# Patient Record
Sex: Female | Born: 1970 | Race: White | Hispanic: No | Marital: Married | Smoking: Never smoker
Health system: Southern US, Community
[De-identification: ages and names within clinical notes are randomized; demographics above are authoritative.]

## PROBLEM LIST (undated history)

## (undated) ENCOUNTER — Inpatient Hospital Stay (HOSPITAL_COMMUNITY): Payer: Self-pay

## (undated) DIAGNOSIS — Z9889 Other specified postprocedural states: Secondary | ICD-10-CM

## (undated) DIAGNOSIS — R112 Nausea with vomiting, unspecified: Secondary | ICD-10-CM

## (undated) DIAGNOSIS — Z8619 Personal history of other infectious and parasitic diseases: Secondary | ICD-10-CM

## (undated) DIAGNOSIS — O09529 Supervision of elderly multigravida, unspecified trimester: Secondary | ICD-10-CM

## (undated) DIAGNOSIS — G43909 Migraine, unspecified, not intractable, without status migrainosus: Secondary | ICD-10-CM

## (undated) DIAGNOSIS — C801 Malignant (primary) neoplasm, unspecified: Secondary | ICD-10-CM

## (undated) DIAGNOSIS — F419 Anxiety disorder, unspecified: Secondary | ICD-10-CM

## (undated) HISTORY — DX: Anxiety disorder, unspecified: F41.9

## (undated) HISTORY — DX: Personal history of other infectious and parasitic diseases: Z86.19

## (undated) HISTORY — PX: GYNECOLOGIC CRYOSURGERY: SHX857

## (undated) HISTORY — DX: Malignant (primary) neoplasm, unspecified: C80.1

## (undated) HISTORY — DX: Supervision of elderly multigravida, unspecified trimester: O09.529

---

## 1992-05-10 HISTORY — PX: WISDOM TOOTH EXTRACTION: SHX21

## 1997-07-17 ENCOUNTER — Inpatient Hospital Stay: Admission: AD | Admit: 1997-07-17 | Discharge: 1997-07-17 | Payer: Self-pay | Admitting: Obstetrics and Gynecology

## 1997-08-21 ENCOUNTER — Inpatient Hospital Stay (HOSPITAL_COMMUNITY): Admission: AD | Admit: 1997-08-21 | Discharge: 1997-08-23 | Payer: Self-pay | Admitting: Obstetrics and Gynecology

## 1997-09-17 ENCOUNTER — Encounter: Admission: RE | Admit: 1997-09-17 | Discharge: 1997-12-16 | Payer: Self-pay | Admitting: Obstetrics and Gynecology

## 1999-10-30 ENCOUNTER — Other Ambulatory Visit: Admission: RE | Admit: 1999-10-30 | Discharge: 1999-10-30 | Payer: Self-pay | Admitting: Emergency Medicine

## 2003-05-09 ENCOUNTER — Other Ambulatory Visit: Admission: RE | Admit: 2003-05-09 | Discharge: 2003-05-09 | Payer: Self-pay | Admitting: Obstetrics and Gynecology

## 2003-05-11 HISTORY — PX: MELANOMA EXCISION: SHX5266

## 2003-10-08 ENCOUNTER — Encounter: Admission: RE | Admit: 2003-10-08 | Discharge: 2003-10-08 | Payer: Self-pay | Admitting: Obstetrics and Gynecology

## 2003-11-16 ENCOUNTER — Inpatient Hospital Stay (HOSPITAL_COMMUNITY): Admission: AD | Admit: 2003-11-16 | Discharge: 2003-11-16 | Payer: Self-pay | Admitting: Obstetrics and Gynecology

## 2003-12-01 ENCOUNTER — Inpatient Hospital Stay (HOSPITAL_COMMUNITY): Admission: AD | Admit: 2003-12-01 | Discharge: 2003-12-03 | Payer: Self-pay | Admitting: Obstetrics & Gynecology

## 2004-01-15 ENCOUNTER — Other Ambulatory Visit: Admission: RE | Admit: 2004-01-15 | Discharge: 2004-01-15 | Payer: Self-pay | Admitting: Obstetrics and Gynecology

## 2004-03-10 ENCOUNTER — Ambulatory Visit (HOSPITAL_BASED_OUTPATIENT_CLINIC_OR_DEPARTMENT_OTHER): Admission: RE | Admit: 2004-03-10 | Discharge: 2004-03-10 | Payer: Self-pay

## 2004-03-10 ENCOUNTER — Ambulatory Visit (HOSPITAL_COMMUNITY): Admission: RE | Admit: 2004-03-10 | Discharge: 2004-03-10 | Payer: Self-pay

## 2005-04-20 ENCOUNTER — Other Ambulatory Visit: Admission: RE | Admit: 2005-04-20 | Discharge: 2005-04-20 | Payer: Self-pay | Admitting: Obstetrics and Gynecology

## 2011-08-23 LAB — OB RESULTS CONSOLE ANTIBODY SCREEN: Antibody Screen: NEGATIVE

## 2011-08-23 LAB — OB RESULTS CONSOLE GC/CHLAMYDIA: Gonorrhea: NEGATIVE

## 2011-08-23 LAB — OB RESULTS CONSOLE HEPATITIS B SURFACE ANTIGEN: Hepatitis B Surface Ag: NEGATIVE

## 2011-08-23 LAB — OB RESULTS CONSOLE ABO/RH: RH Type: POSITIVE

## 2011-08-25 ENCOUNTER — Other Ambulatory Visit (HOSPITAL_COMMUNITY): Payer: Self-pay | Admitting: Obstetrics and Gynecology

## 2011-08-25 DIAGNOSIS — R1011 Right upper quadrant pain: Secondary | ICD-10-CM

## 2011-08-26 ENCOUNTER — Ambulatory Visit (HOSPITAL_COMMUNITY)
Admission: RE | Admit: 2011-08-26 | Discharge: 2011-08-26 | Disposition: A | Discharge status: Home / Self Care | Payer: Federal, State, Local not specified - PPO | Source: Ambulatory Visit | Attending: Obstetrics and Gynecology | Admitting: Obstetrics and Gynecology

## 2011-08-26 DIAGNOSIS — R1011 Right upper quadrant pain: Secondary | ICD-10-CM

## 2011-08-26 DIAGNOSIS — O99891 Other specified diseases and conditions complicating pregnancy: Secondary | ICD-10-CM | POA: Insufficient documentation

## 2011-08-26 DIAGNOSIS — K7689 Other specified diseases of liver: Secondary | ICD-10-CM | POA: Insufficient documentation

## 2012-02-26 ENCOUNTER — Inpatient Hospital Stay (HOSPITAL_COMMUNITY)
Admission: AD | Admit: 2012-02-26 | Discharge: 2012-02-26 | Disposition: A | Discharge status: Home / Self Care | Payer: Federal, State, Local not specified - PPO | Source: Ambulatory Visit | Attending: Obstetrics and Gynecology | Admitting: Obstetrics and Gynecology

## 2012-02-26 ENCOUNTER — Encounter (HOSPITAL_COMMUNITY): Payer: Self-pay | Admitting: *Deleted

## 2012-02-26 DIAGNOSIS — O99891 Other specified diseases and conditions complicating pregnancy: Secondary | ICD-10-CM | POA: Insufficient documentation

## 2012-02-26 DIAGNOSIS — N949 Unspecified condition associated with female genital organs and menstrual cycle: Secondary | ICD-10-CM | POA: Insufficient documentation

## 2012-02-26 HISTORY — DX: Nausea with vomiting, unspecified: R11.2

## 2012-02-26 HISTORY — DX: Other specified postprocedural states: Z98.890

## 2012-02-26 LAB — POCT FERN TEST: Fern Test: NEGATIVE

## 2012-02-26 NOTE — MAU Note (Signed)
Report given to Dr. Arelia Sneddon patient G3P2 [redacted]w[redacted]d presented to MAU with c/o possible SROM, spec exam done, fern test negative, cervix closed/thick/high, fhr reactive, received order to discharge patient to home.

## 2012-02-26 NOTE — MAU Provider Note (Signed)
Speculum exam for questionable ROM- no pooling of fluid noted; creamy beige discharge noted; cervix closed.   Pt gives hx of one episode of ?leakage after IC this morning. FHR reactive occ ctx.

## 2012-02-26 NOTE — MAU Note (Signed)
Pt woke up this morning she noticed some fluid leaking out of her vagina this morning. None since then. Reported she did have intercourse last night and it may be from that but wanted to make sure. Reports good fetal movement and mild occasional contractions that she has been having (not new).

## 2012-02-26 NOTE — MAU Note (Signed)
Has gush of fluid this morning upon awakening, intercourse last night but patient is concerned could be her amniotic fluid.

## 2012-03-02 LAB — OB RESULTS CONSOLE GBS: GBS: NEGATIVE

## 2012-03-20 ENCOUNTER — Encounter (HOSPITAL_COMMUNITY): Payer: Self-pay | Admitting: *Deleted

## 2012-03-20 ENCOUNTER — Telehealth (HOSPITAL_COMMUNITY): Payer: Self-pay | Admitting: *Deleted

## 2012-03-20 NOTE — Telephone Encounter (Signed)
Preadmission screen  

## 2012-03-21 ENCOUNTER — Encounter (HOSPITAL_COMMUNITY): Payer: Self-pay | Admitting: Anesthesiology

## 2012-03-21 ENCOUNTER — Inpatient Hospital Stay (HOSPITAL_COMMUNITY)
Admission: RE | Admit: 2012-03-21 | Discharge: 2012-03-23 | DRG: 371 | Disposition: A | Discharge status: Home / Self Care | Payer: Federal, State, Local not specified - PPO | Source: Ambulatory Visit | Attending: Obstetrics and Gynecology | Admitting: Obstetrics and Gynecology

## 2012-03-21 ENCOUNTER — Encounter (HOSPITAL_COMMUNITY): Payer: Self-pay

## 2012-03-21 ENCOUNTER — Encounter (HOSPITAL_COMMUNITY)
Admission: RE | Disposition: A | Discharge status: Home / Self Care | Payer: Self-pay | Source: Ambulatory Visit | Attending: Obstetrics and Gynecology

## 2012-03-21 ENCOUNTER — Observation Stay (HOSPITAL_COMMUNITY): Payer: Federal, State, Local not specified - PPO | Admitting: Anesthesiology

## 2012-03-21 DIAGNOSIS — O409XX0 Polyhydramnios, unspecified trimester, not applicable or unspecified: Secondary | ICD-10-CM | POA: Diagnosis present

## 2012-03-21 DIAGNOSIS — O09529 Supervision of elderly multigravida, unspecified trimester: Secondary | ICD-10-CM | POA: Diagnosis present

## 2012-03-21 LAB — CBC
MCH: 29.7 pg (ref 26.0–34.0)
MCHC: 33.2 g/dL (ref 30.0–36.0)
Platelets: 144 10*3/uL — ABNORMAL LOW (ref 150–400)
Platelets: 120 10*3/uL — ABNORMAL LOW (ref 150–400)
RBC: 4.18 MIL/uL (ref 3.87–5.11)
RBC: 3.8 MIL/uL — ABNORMAL LOW (ref 3.87–5.11)
WBC: 7.1 10*3/uL (ref 4.0–10.5)

## 2012-03-21 LAB — RPR: RPR Ser Ql: NONREACTIVE

## 2012-03-21 LAB — ABO/RH: ABO/RH(D): O POS

## 2012-03-21 SURGERY — Surgical Case
Anesthesia: Epidural | Site: Abdomen | Wound class: Clean Contaminated

## 2012-03-21 MED ORDER — ONDANSETRON HCL 4 MG/2ML IJ SOLN
INTRAMUSCULAR | Status: AC
  Filled 2012-03-21: qty 2

## 2012-03-21 MED ORDER — HYDROMORPHONE HCL PF 1 MG/ML IJ SOLN
INTRAMUSCULAR | Status: AC
  Filled 2012-03-21: qty 1

## 2012-03-21 MED ORDER — DIPHENHYDRAMINE HCL 50 MG/ML IJ SOLN: 12.5000 mg | INTRAMUSCULAR | Status: DC | PRN

## 2012-03-21 MED ORDER — DIPHENHYDRAMINE HCL 50 MG/ML IJ SOLN
12.5000 mg | INTRAMUSCULAR | Status: DC | PRN
  Administered 2012-03-22: 12.5 mg via INTRAVENOUS
  Filled 2012-03-21: qty 1

## 2012-03-21 MED ORDER — SIMETHICONE 80 MG PO CHEW
80.0000 mg | CHEWABLE_TABLET | Freq: Three times a day (TID) | ORAL | Status: DC
  Administered 2012-03-22 – 2012-03-23 (×5): 80 mg via ORAL

## 2012-03-21 MED ORDER — ZOLPIDEM TARTRATE 5 MG PO TABS: 5.0000 mg | ORAL_TABLET | Freq: Every evening | ORAL | Status: DC | PRN

## 2012-03-21 MED ORDER — LACTATED RINGERS IV SOLN
INTRAVENOUS | Status: DC | PRN
  Administered 2012-03-21 (×2): via INTRAVENOUS

## 2012-03-21 MED ORDER — METOCLOPRAMIDE HCL 5 MG/ML IJ SOLN: 10.0000 mg | Freq: Three times a day (TID) | INTRAMUSCULAR | Status: DC | PRN

## 2012-03-21 MED ORDER — PHENYLEPHRINE HCL 10 MG/ML IJ SOLN
INTRAMUSCULAR | Status: DC | PRN
  Administered 2012-03-21: 80 ug via INTRAVENOUS
  Administered 2012-03-21: 40 ug via INTRAVENOUS
  Administered 2012-03-21 (×2): 80 ug via INTRAVENOUS
  Administered 2012-03-21 (×2): 40 ug via INTRAVENOUS

## 2012-03-21 MED ORDER — OXYTOCIN BOLUS FROM INFUSION: 500.0000 mL | INTRAVENOUS | Status: DC

## 2012-03-21 MED ORDER — EPHEDRINE 5 MG/ML INJ: 10.0000 mg | INTRAVENOUS | Status: DC | PRN

## 2012-03-21 MED ORDER — IBUPROFEN 600 MG PO TABS: 600.0000 mg | ORAL_TABLET | Freq: Four times a day (QID) | ORAL | Status: DC | PRN

## 2012-03-21 MED ORDER — OXYTOCIN 10 UNIT/ML IJ SOLN
40.0000 [IU] | INTRAVENOUS | Status: DC | PRN
  Administered 2012-03-21: 40 [IU] via INTRAVENOUS

## 2012-03-21 MED ORDER — ONDANSETRON HCL 4 MG/2ML IJ SOLN: 4.0000 mg | Freq: Four times a day (QID) | INTRAMUSCULAR | Status: DC | PRN

## 2012-03-21 MED ORDER — KETOROLAC TROMETHAMINE 60 MG/2ML IM SOLN
60.0000 mg | Freq: Once | INTRAMUSCULAR | Status: AC | PRN
  Filled 2012-03-21: qty 2

## 2012-03-21 MED ORDER — BUTORPHANOL TARTRATE 1 MG/ML IJ SOLN: 1.0000 mg | INTRAMUSCULAR | Status: DC | PRN

## 2012-03-21 MED ORDER — OXYTOCIN 40 UNITS IN LACTATED RINGERS INFUSION - SIMPLE MED: 62.5000 mL/h | INTRAVENOUS | Status: DC

## 2012-03-21 MED ORDER — MEPERIDINE HCL 25 MG/ML IJ SOLN: 6.2500 mg | INTRAMUSCULAR | Status: DC | PRN

## 2012-03-21 MED ORDER — DIPHENHYDRAMINE HCL 25 MG PO CAPS
25.0000 mg | ORAL_CAPSULE | ORAL | Status: DC | PRN
  Administered 2012-03-22: 25 mg via ORAL
  Filled 2012-03-21: qty 1

## 2012-03-21 MED ORDER — SCOPOLAMINE 1 MG/3DAYS TD PT72
MEDICATED_PATCH | TRANSDERMAL | Status: AC
  Filled 2012-03-21: qty 1

## 2012-03-21 MED ORDER — ONDANSETRON HCL 4 MG PO TABS: 4.0000 mg | ORAL_TABLET | ORAL | Status: DC | PRN

## 2012-03-21 MED ORDER — SODIUM BICARBONATE 8.4 % IV SOLN
INTRAVENOUS | Status: AC
  Filled 2012-03-21: qty 50

## 2012-03-21 MED ORDER — MEPERIDINE HCL 25 MG/ML IJ SOLN
6.2500 mg | INTRAMUSCULAR | Status: DC | PRN
  Administered 2012-03-21: 6.25 mg via INTRAVENOUS

## 2012-03-21 MED ORDER — WITCH HAZEL-GLYCERIN EX PADS: 1.0000 "application " | MEDICATED_PAD | CUTANEOUS | Status: DC | PRN

## 2012-03-21 MED ORDER — LACTATED RINGERS IV SOLN
INTRAVENOUS | Status: DC
  Administered 2012-03-22 (×2): via INTRAVENOUS

## 2012-03-21 MED ORDER — DIPHENHYDRAMINE HCL 50 MG/ML IJ SOLN: 25.0000 mg | INTRAMUSCULAR | Status: DC | PRN

## 2012-03-21 MED ORDER — EPHEDRINE 5 MG/ML INJ
10.0000 mg | INTRAVENOUS | Status: DC | PRN
  Administered 2012-03-21: 10 mg via INTRAVENOUS
  Filled 2012-03-21: qty 4

## 2012-03-21 MED ORDER — LACTATED RINGERS IV SOLN: 500.0000 mL | Freq: Once | INTRAVENOUS | Status: DC

## 2012-03-21 MED ORDER — LANOLIN HYDROUS EX OINT: 1.0000 "application " | TOPICAL_OINTMENT | CUTANEOUS | Status: DC | PRN

## 2012-03-21 MED ORDER — SENNOSIDES-DOCUSATE SODIUM 8.6-50 MG PO TABS
2.0000 | ORAL_TABLET | Freq: Every day | ORAL | Status: DC
  Administered 2012-03-22: 2 via ORAL

## 2012-03-21 MED ORDER — SODIUM CHLORIDE 0.9 % IV SOLN
1.0000 ug/kg/h | INTRAVENOUS | Status: DC | PRN
  Filled 2012-03-21: qty 2.5

## 2012-03-21 MED ORDER — SODIUM BICARBONATE 8.4 % IV SOLN
INTRAVENOUS | Status: DC | PRN
  Administered 2012-03-21: 3 mL via EPIDURAL

## 2012-03-21 MED ORDER — PHENYLEPHRINE 40 MCG/ML (10ML) SYRINGE FOR IV PUSH (FOR BLOOD PRESSURE SUPPORT)
PREFILLED_SYRINGE | INTRAVENOUS | Status: AC
  Filled 2012-03-21: qty 15

## 2012-03-21 MED ORDER — DIPHENHYDRAMINE HCL 25 MG PO CAPS: 25.0000 mg | ORAL_CAPSULE | Freq: Four times a day (QID) | ORAL | Status: DC | PRN

## 2012-03-21 MED ORDER — NALBUPHINE HCL 10 MG/ML IJ SOLN
5.0000 mg | INTRAMUSCULAR | Status: DC | PRN
  Administered 2012-03-22: 5 mg via INTRAVENOUS
  Filled 2012-03-21 (×2): qty 1

## 2012-03-21 MED ORDER — LIDOCAINE HCL (PF) 1 % IJ SOLN: 30.0000 mL | INTRAMUSCULAR | Status: DC | PRN

## 2012-03-21 MED ORDER — NALOXONE HCL 0.4 MG/ML IJ SOLN: 0.4000 mg | INTRAMUSCULAR | Status: DC | PRN

## 2012-03-21 MED ORDER — MORPHINE SULFATE (PF) 0.5 MG/ML IJ SOLN
INTRAMUSCULAR | Status: DC | PRN
  Administered 2012-03-21: 4 mg via EPIDURAL

## 2012-03-21 MED ORDER — EPHEDRINE SULFATE 50 MG/ML IJ SOLN
INTRAMUSCULAR | Status: DC | PRN
  Administered 2012-03-21: 10 mg via INTRAVENOUS

## 2012-03-21 MED ORDER — MEPERIDINE HCL 25 MG/ML IJ SOLN
INTRAMUSCULAR | Status: DC | PRN
  Administered 2012-03-21 (×2): 12.5 mg via INTRAVENOUS

## 2012-03-21 MED ORDER — LACTATED RINGERS IV SOLN
INTRAVENOUS | Status: DC | PRN
  Administered 2012-03-21: 19:00:00 via INTRAVENOUS

## 2012-03-21 MED ORDER — PHENYLEPHRINE 40 MCG/ML (10ML) SYRINGE FOR IV PUSH (FOR BLOOD PRESSURE SUPPORT)
80.0000 ug | PREFILLED_SYRINGE | INTRAVENOUS | Status: DC | PRN
  Filled 2012-03-21: qty 5

## 2012-03-21 MED ORDER — CEFAZOLIN SODIUM-DEXTROSE 2-3 GM-% IV SOLR
INTRAVENOUS | Status: DC | PRN
  Administered 2012-03-21: 2 g via INTRAVENOUS

## 2012-03-21 MED ORDER — KETOROLAC TROMETHAMINE 30 MG/ML IJ SOLN: 30.0000 mg | Freq: Four times a day (QID) | INTRAMUSCULAR | Status: AC | PRN

## 2012-03-21 MED ORDER — LACTATED RINGERS IV SOLN: 500.0000 mL | INTRAVENOUS | Status: DC | PRN

## 2012-03-21 MED ORDER — LACTATED RINGERS IV SOLN
INTRAVENOUS | Status: DC
  Administered 2012-03-21 (×3): via INTRAVENOUS

## 2012-03-21 MED ORDER — LIDOCAINE HCL (PF) 1 % IJ SOLN
INTRAMUSCULAR | Status: DC | PRN
  Administered 2012-03-21 (×2): 9 mL

## 2012-03-21 MED ORDER — TERBUTALINE SULFATE 1 MG/ML IJ SOLN: 0.2500 mg | Freq: Once | INTRAMUSCULAR | Status: DC | PRN

## 2012-03-21 MED ORDER — ONDANSETRON HCL 4 MG/2ML IJ SOLN: 4.0000 mg | Freq: Three times a day (TID) | INTRAMUSCULAR | Status: DC | PRN

## 2012-03-21 MED ORDER — EPHEDRINE 5 MG/ML INJ
INTRAVENOUS | Status: AC
  Filled 2012-03-21: qty 10

## 2012-03-21 MED ORDER — ACETAMINOPHEN 325 MG PO TABS: 650.0000 mg | ORAL_TABLET | ORAL | Status: DC | PRN

## 2012-03-21 MED ORDER — NALBUPHINE HCL 10 MG/ML IJ SOLN
5.0000 mg | INTRAMUSCULAR | Status: DC | PRN
  Administered 2012-03-22: 5 mg via SUBCUTANEOUS
  Administered 2012-03-22: 10 mg via SUBCUTANEOUS
  Filled 2012-03-21 (×2): qty 1

## 2012-03-21 MED ORDER — DIBUCAINE 1 % RE OINT: 1.0000 "application " | TOPICAL_OINTMENT | RECTAL | Status: DC | PRN

## 2012-03-21 MED ORDER — OXYCODONE-ACETAMINOPHEN 5-325 MG PO TABS
1.0000 | ORAL_TABLET | ORAL | Status: DC | PRN
  Administered 2012-03-22: 1 via ORAL
  Administered 2012-03-22: 2 via ORAL
  Administered 2012-03-22 (×3): 1 via ORAL
  Administered 2012-03-23: 2 via ORAL
  Administered 2012-03-23: 1 via ORAL
  Administered 2012-03-23: 2 via ORAL
  Administered 2012-03-23: 1 via ORAL
  Filled 2012-03-21 (×2): qty 2
  Filled 2012-03-21 (×3): qty 1
  Filled 2012-03-21 (×2): qty 2
  Filled 2012-03-21 (×2): qty 1

## 2012-03-21 MED ORDER — SCOPOLAMINE 1 MG/3DAYS TD PT72
1.0000 | MEDICATED_PATCH | Freq: Once | TRANSDERMAL | Status: DC
  Administered 2012-03-21: 1.5 mg via TRANSDERMAL

## 2012-03-21 MED ORDER — PROMETHAZINE HCL 25 MG/ML IJ SOLN: 6.2500 mg | INTRAMUSCULAR | Status: DC | PRN

## 2012-03-21 MED ORDER — MEPERIDINE HCL 25 MG/ML IJ SOLN
INTRAMUSCULAR | Status: AC
  Filled 2012-03-21: qty 1

## 2012-03-21 MED ORDER — MORPHINE SULFATE 0.5 MG/ML IJ SOLN
INTRAMUSCULAR | Status: AC
  Filled 2012-03-21: qty 10

## 2012-03-21 MED ORDER — SIMETHICONE 80 MG PO CHEW: 80.0000 mg | CHEWABLE_TABLET | ORAL | Status: DC | PRN

## 2012-03-21 MED ORDER — OXYTOCIN 40 UNITS IN LACTATED RINGERS INFUSION - SIMPLE MED: 62.5000 mL/h | INTRAVENOUS | Status: AC

## 2012-03-21 MED ORDER — MENTHOL 3 MG MT LOZG: 1.0000 | LOZENGE | OROMUCOSAL | Status: DC | PRN

## 2012-03-21 MED ORDER — ONDANSETRON HCL 4 MG/2ML IJ SOLN: 4.0000 mg | INTRAMUSCULAR | Status: DC | PRN

## 2012-03-21 MED ORDER — FENTANYL 2.5 MCG/ML BUPIVACAINE 1/10 % EPIDURAL INFUSION (WH - ANES)
INTRAMUSCULAR | Status: DC | PRN
  Administered 2012-03-21: 14 mL/h via EPIDURAL

## 2012-03-21 MED ORDER — IBUPROFEN 600 MG PO TABS
600.0000 mg | ORAL_TABLET | Freq: Four times a day (QID) | ORAL | Status: DC
  Administered 2012-03-22 – 2012-03-23 (×6): 600 mg via ORAL
  Filled 2012-03-21 (×7): qty 1

## 2012-03-21 MED ORDER — PHENYLEPHRINE 40 MCG/ML (10ML) SYRINGE FOR IV PUSH (FOR BLOOD PRESSURE SUPPORT): 80.0000 ug | PREFILLED_SYRINGE | INTRAVENOUS | Status: DC | PRN

## 2012-03-21 MED ORDER — TETANUS-DIPHTH-ACELL PERTUSSIS 5-2.5-18.5 LF-MCG/0.5 IM SUSP: 0.5000 mL | Freq: Once | INTRAMUSCULAR | Status: DC

## 2012-03-21 MED ORDER — SODIUM CHLORIDE 0.9 % IJ SOLN: 3.0000 mL | INTRAMUSCULAR | Status: DC | PRN

## 2012-03-21 MED ORDER — ONDANSETRON HCL 4 MG/2ML IJ SOLN
INTRAMUSCULAR | Status: DC | PRN
  Administered 2012-03-21: 4 mg via INTRAVENOUS

## 2012-03-21 MED ORDER — LIDOCAINE-EPINEPHRINE (PF) 2 %-1:200000 IJ SOLN
INTRAMUSCULAR | Status: AC
  Filled 2012-03-21: qty 20

## 2012-03-21 MED ORDER — KETOROLAC TROMETHAMINE 60 MG/2ML IM SOLN
INTRAMUSCULAR | Status: AC
  Filled 2012-03-21: qty 2

## 2012-03-21 MED ORDER — PHENYLEPHRINE 40 MCG/ML (10ML) SYRINGE FOR IV PUSH (FOR BLOOD PRESSURE SUPPORT)
PREFILLED_SYRINGE | INTRAVENOUS | Status: AC
  Filled 2012-03-21: qty 5

## 2012-03-21 MED ORDER — FENTANYL 2.5 MCG/ML BUPIVACAINE 1/10 % EPIDURAL INFUSION (WH - ANES)
14.0000 mL/h | INTRAMUSCULAR | Status: DC
  Filled 2012-03-21: qty 125

## 2012-03-21 MED ORDER — OXYTOCIN 40 UNITS IN LACTATED RINGERS INFUSION - SIMPLE MED
1.0000 m[IU]/min | INTRAVENOUS | Status: DC
  Administered 2012-03-21: 1 m[IU]/min via INTRAVENOUS
  Filled 2012-03-21: qty 1000

## 2012-03-21 MED ORDER — PRENATAL MULTIVITAMIN CH
1.0000 | ORAL_TABLET | Freq: Every day | ORAL | Status: DC
  Administered 2012-03-22 – 2012-03-23 (×2): 1 via ORAL
  Filled 2012-03-21 (×2): qty 1

## 2012-03-21 MED ORDER — KETOROLAC TROMETHAMINE 30 MG/ML IJ SOLN
30.0000 mg | Freq: Four times a day (QID) | INTRAMUSCULAR | Status: AC | PRN
  Administered 2012-03-22: 30 mg via INTRAVENOUS
  Filled 2012-03-21: qty 1

## 2012-03-21 MED ORDER — KETOROLAC TROMETHAMINE 30 MG/ML IJ SOLN: 15.0000 mg | Freq: Once | INTRAMUSCULAR | Status: DC | PRN

## 2012-03-21 MED ORDER — OXYTOCIN 10 UNIT/ML IJ SOLN
INTRAMUSCULAR | Status: AC
  Filled 2012-03-21: qty 4

## 2012-03-21 MED ORDER — CITRIC ACID-SODIUM CITRATE 334-500 MG/5ML PO SOLN
30.0000 mL | ORAL | Status: DC | PRN
  Administered 2012-03-21: 30 mL via ORAL
  Filled 2012-03-21: qty 15

## 2012-03-21 MED ORDER — HYDROMORPHONE HCL PF 1 MG/ML IJ SOLN
0.2500 mg | INTRAMUSCULAR | Status: DC | PRN
  Administered 2012-03-21 (×4): 0.5 mg via INTRAVENOUS

## 2012-03-21 MED ORDER — OXYCODONE-ACETAMINOPHEN 5-325 MG PO TABS: 1.0000 | ORAL_TABLET | ORAL | Status: DC | PRN

## 2012-03-21 SURGICAL SUPPLY — 32 items
BENZOIN TINCTURE PRP APPL 2/3 (GAUZE/BANDAGES/DRESSINGS) IMPLANT
CLOTH BEACON ORANGE TIMEOUT ST (SAFETY) ×2 IMPLANT
CONTAINER PREFILL 10% NBF 15ML (MISCELLANEOUS) IMPLANT
DERMABOND ADVANCED (GAUZE/BANDAGES/DRESSINGS)
DERMABOND ADVANCED .7 DNX12 (GAUZE/BANDAGES/DRESSINGS) IMPLANT
DRAPE SURG 17X23 STRL (DRAPES) IMPLANT
DRESSING TELFA 8X3 (GAUZE/BANDAGES/DRESSINGS) IMPLANT
DRSG COVADERM 4X10 (GAUZE/BANDAGES/DRESSINGS) ×4 IMPLANT
DURAPREP 26ML APPLICATOR (WOUND CARE) ×2 IMPLANT
ELECT REM PT RETURN 9FT ADLT (ELECTROSURGICAL) ×2
ELECTRODE REM PT RTRN 9FT ADLT (ELECTROSURGICAL) ×1 IMPLANT
EXTRACTOR VACUUM M CUP 4 TUBE (SUCTIONS) IMPLANT
GAUZE SPONGE 4X4 12PLY STRL LF (GAUZE/BANDAGES/DRESSINGS) IMPLANT
GLOVE BIO SURGEON STRL SZ8 (GLOVE) ×4 IMPLANT
GOWN PREVENTION PLUS LG XLONG (DISPOSABLE) ×2 IMPLANT
KIT ABG SYR 3ML LUER SLIP (SYRINGE) ×2 IMPLANT
NEEDLE HYPO 25X5/8 SAFETYGLIDE (NEEDLE) ×2 IMPLANT
NS IRRIG 1000ML POUR BTL (IV SOLUTION) ×2 IMPLANT
PACK C SECTION WH (CUSTOM PROCEDURE TRAY) ×2 IMPLANT
PAD ABD 7.5X8 STRL (GAUZE/BANDAGES/DRESSINGS) IMPLANT
PAD OB MATERNITY 4.3X12.25 (PERSONAL CARE ITEMS) ×2 IMPLANT
SLEEVE SCD COMPRESS KNEE MED (MISCELLANEOUS) ×2 IMPLANT
STAPLER VISISTAT 35W (STAPLE) ×2 IMPLANT
STRIP CLOSURE SKIN 1/2X4 (GAUZE/BANDAGES/DRESSINGS) IMPLANT
SUT MNCRL 0 VIOLET CTX 36 (SUTURE) ×4 IMPLANT
SUT MONOCRYL 0 CTX 36 (SUTURE) ×4
SUT PDS AB 0 CTX 60 (SUTURE) ×2 IMPLANT
SUT PLAIN 0 NONE (SUTURE) IMPLANT
SUT VIC AB 4-0 KS 27 (SUTURE) ×2 IMPLANT
TOWEL OR 17X24 6PK STRL BLUE (TOWEL DISPOSABLE) ×4 IMPLANT
TRAY FOLEY CATH 14FR (SET/KITS/TRAYS/PACK) IMPLANT
WATER STERILE IRR 1000ML POUR (IV SOLUTION) IMPLANT

## 2012-03-21 NOTE — Brief Op Note (Signed)
03/21/2012  7:51 PM  PATIENT:  Majel Homer Dry  41 y.o. female  PRE-OPERATIVE DIAGNOSIS:  Arrest of Descent  POST-OPERATIVE DIAGNOSIS:  Arrest of Descent  PROCEDURE:  Procedure(s) (LRB) with comments: CESAREAN SECTION (N/A)  SURGEON:  Surgeon(s) and Role:    * Leslie Andrea, MD - Primary  PHYSICIAN ASSISTANT:   ASSISTANTS: none   ANESTHESIA:   epidural  EBL:  Total I/O In: 1000 [I.V.:1000] Out: 800 [Blood:800]  BLOOD ADMINISTERED:none  DRAINS: Urinary Catheter (Foley)   LOCAL MEDICATIONS USED:  NONE  SPECIMEN:  No Specimen  DISPOSITION OF SPECIMEN:  N/A  COUNTS:  YES  TOURNIQUET:  * No tourniquets in log *  DICTATION: .Other Dictation: Dictation Number [id number]  PLAN OF CARE: Admit to inpatient   PATIENT DISPOSITION:  PACU - hemodynamically stable.   Delay start of Pharmacological VTE agent (>24hrs) due to surgical blood loss or risk of bleeding: not applicable

## 2012-03-21 NOTE — Anesthesia Procedure Notes (Signed)
Epidural Patient location during procedure: OB Start time: 03/21/2012 12:24 PM End time: 03/21/2012 12:28 PM  Staffing Anesthesiologist: Sandrea Hughs Performed by: anesthesiologist   Preanesthetic Checklist Completed: patient identified, site marked, surgical consent, pre-op evaluation, timeout performed, IV checked, risks and benefits discussed and monitors and equipment checked  Epidural Patient position: sitting Prep: site prepped and draped and DuraPrep Patient monitoring: continuous pulse ox and blood pressure Approach: midline Injection technique: LOR air  Needle:  Needle type: Tuohy  Needle gauge: 17 G Needle length: 9 cm and 9 Needle insertion depth: 9 cm Catheter type: closed end flexible Catheter size: 19 Gauge Catheter at skin depth: 14 cm Test dose: negative and Other  Assessment Sensory level: T8 Events: blood not aspirated, injection not painful, no injection resistance, negative IV test and no paresthesia  Additional Notes Reason for block:procedure for pain

## 2012-03-21 NOTE — Anesthesia Postprocedure Evaluation (Signed)
  Anesthesia Post-op Note  Patient: [name]  Procedure(s) Performed: Procedure(s) (LRB) with comments: CESAREAN SECTION (N/A)  Patient is awake, responsive, moving her legs, and has signs of resolution of her numbness. Pain and nausea are reasonably well controlled. Vital signs are stable and clinically acceptable. Oxygen saturation is clinically acceptable. There are no apparent anesthetic complications at this time. Patient is ready for discharge.

## 2012-03-21 NOTE — Progress Notes (Signed)
Cx no change FHT reactive UCs no change  A: arrest of dilation  P: Rec cesarean section     D/W patient and husband above and risks-infection, organ damage, bleeding/transfusion-HIV/Hep, DVT/PE, pneumonia.  All questions answered.

## 2012-03-21 NOTE — H&P (Signed)
[name] is a 41 y.o. female presenting for induction of labor.  U/S c/w polyhydramnios and EFW of about 8.25# 1 week ago.  Glucola normal. No HA/vision change/epigastric pain. Cx is 1/50 in office yesterday. Maternal Medical History:  Fetal activity: Perceived fetal activity is normal.      OB History    Grav Para Term Preterm Abortions TAB SAB Ect Mult Living   3 2 2       2      Past Medical History  Diagnosis Date  . Gestational diabetes     previous pregnancy  . PONV (postoperative nausea and vomiting)   . H/O varicella   . Cancer     skin  . AMA (advanced maternal age) multigravida 35+   . Anxiety    Past Surgical History  Procedure Date  . Wisdom tooth extraction   . Gynecologic cryosurgery   . Melanoma excision 2005    L arm   Family History: family history includes COPD in her maternal grandmother and paternal grandmother; Cancer in her maternal grandmother; Depression in her mother; Diabetes in her father; Heart attack in her paternal grandfather; and Hypertension in her father.  There is no history of Other. Social History:  reports that she has never smoked. She has never used smokeless tobacco. She reports that she does not drink alcohol or use illicit drugs.   Prenatal Transfer Tool  Maternal Diabetes: No Genetic Screening: Normal Maternal Ultrasounds/Referrals: Normal Fetal Ultrasounds or other Referrals:  None Maternal Substance Abuse:  No Significant Maternal Medications:  None Significant Maternal Lab Results:  None Other Comments:  None  Review of Systems  Eyes: Negative for blurred vision.  Gastrointestinal: Negative for abdominal pain.  Neurological: Negative for headaches.    Dilation: 2 Effacement (%): 90 Station: -3 Exam by:: Darcella Shiffman Blood pressure 131/79, pulse 91, height 5\' 4"  (1.626 m), weight 240 lb (108.863 kg). Maternal Exam:  Uterine Assessment: Contraction strength is mild.  Contraction frequency is irregular.   Abdomen:  Fetal presentation: vertex     Fetal Exam Fetal Monitor Review: Pattern: accelerations present.       Physical Exam  Cardiovascular: Normal rate and regular rhythm.   Respiratory: Effort normal and breath sounds normal.  GI: There is no tenderness.  Neurological: She has normal reflexes.   bedside U/S = vtx vtx on cx exam Prenatal labs: ABO, Rh: O/Positive/-- (04/15 0000) Antibody: Negative (04/15 0000) Rubella: Immune (04/15 0000) RPR: Nonreactive (04/15 0000)  HBsAg: Negative (04/15 0000)  HIV: Non-reactive (04/15 0000)  GBS: Negative (10/24 0000)   Assessment/Plan: 41 yo G3P2 at 22 0/7 weeks with polyhydramnios, vertex, favorable cervix for labor induction. Risks reviewed with patient.   Brixton Schnapp II,Pervis Macintyre E 03/21/2012, 7:51 AM

## 2012-03-21 NOTE — Progress Notes (Signed)
Cx no change FHT reactive UCs MVU about 300

## 2012-03-21 NOTE — Progress Notes (Signed)
FHTreactive UCs q 2-3 min Cx 3/C/-2 AROM clear Epidural in

## 2012-03-21 NOTE — Progress Notes (Signed)
4/C/-2 IUPC placed FHT reactive UCs q 3 min

## 2012-03-21 NOTE — Transfer of Care (Signed)
Immediate Anesthesia Transfer of Care Note  Patient: [name]  Procedure(s) Performed: Procedure(s) (LRB) with comments: CESAREAN SECTION (N/A)  Patient Location: PACU  Anesthesia Type:Epidural  Level of Consciousness: awake, alert  and oriented  Airway & Oxygen Therapy: Patient Spontanous Breathing  Post-op Assessment: Report given to PACU RN and Post -op Vital signs reviewed and stable  Post vital signs: Reviewed and stable  Complications: No apparent anesthesia complications

## 2012-03-21 NOTE — Anesthesia Preprocedure Evaluation (Signed)
Anesthesia Evaluation  Patient identified by MRN, date of birth, ID band Patient awake    Reviewed: Allergy & Precautions, H&P , NPO status , Patient's Chart, lab work & pertinent test results  Airway Mallampati: III TM Distance: >3 FB Neck ROM: full    Dental No notable dental hx.    Pulmonary neg pulmonary ROS,    Pulmonary exam normal       Cardiovascular negative cardio ROS      Neuro/Psych negative neurological ROS     GI/Hepatic negative GI ROS, Neg liver ROS,   Endo/Other  Morbid obesity  Renal/GU negative Renal ROS  negative genitourinary   Musculoskeletal negative musculoskeletal ROS (+)   Abdominal (+) + obese,   Peds negative pediatric ROS (+)  Hematology negative hematology ROS (+)   Anesthesia Other Findings   Reproductive/Obstetrics (+) Pregnancy                           Anesthesia Physical Anesthesia Plan  ASA: III  Anesthesia Plan: Epidural   Post-op Pain Management:    Induction:   Airway Management Planned:   Additional Equipment:   Intra-op Plan:   Post-operative Plan:   Informed Consent: I have reviewed the patients History and Physical, chart, labs and discussed the procedure including the risks, benefits and alternatives for the proposed anesthesia with the patient or authorized representative who has indicated his/her understanding and acceptance.     Plan Discussed with:   Anesthesia Plan Comments:         Anesthesia Quick Evaluation  

## 2012-03-22 ENCOUNTER — Encounter (HOSPITAL_COMMUNITY): Payer: Self-pay

## 2012-03-22 LAB — CBC
HCT: 29.6 % — ABNORMAL LOW (ref 36.0–46.0)
Platelets: 121 10*3/uL — ABNORMAL LOW (ref 150–400)
RDW: 13.8 % (ref 11.5–15.5)
WBC: 8.6 10*3/uL (ref 4.0–10.5)

## 2012-03-22 NOTE — Op Note (Signed)
[name], [name]               ACCOUNT NO.:  [id number]  MEDICAL RECORD NO.:  [id number]  LOCATION:  9125                          FACILITY:  [facility]  PHYSICIAN:  Guy Sandifer. Henderson Cloud, M.D. DATE OF BIRTH:  [birth year]  DATE OF PROCEDURE:  03/21/2012 DATE OF DISCHARGE:                              OPERATIVE REPORT   PREOPERATIVE DIAGNOSIS:  Arrest of dilation.  POSTOPERATIVE DIAGNOSIS:  Arrest of dilation.  PROCEDURE:  Low-transverse cesarean section.  SURGEON:  Guy Sandifer. Henderson Cloud, M.D.  ANESTHESIA:  Epidural.  ESTIMATED BLOOD LOSS:  1000 mL.  FINDINGS:  Viable female infant, Apgars of 9 and 9 at 1 and 5 minutes respectively.  Birth weight and arterial cord pH pending.  SPECIMENS:  None.  INDICATIONS AND CONSENT:  This patient is a 41 year old married [place] female, G3, P2, at 39-0/7th weeks, who is admitted for induction of labor.  She progresses to 4-cm dilated, and complete effacement.  She has excellent uterine contractions as documented by an IUPC for several hours with no change.  Arrest of dilation was diagnosed and recommendation for cesarean section was made.  Potential risks and complications were discussed with the patient and her husband preoperatively including, but not limited to, infection, organ damage, bleeding requiring transfusion of blood products with HIV and hepatitis acquisition, DVT, PE, pneumonia.  All questions were answered and consent was signed on the chart.  DESCRIPTION OF PROCEDURE:  The patient taken to the operating room where she was identified.  Epidural anesthetic was augmented to a surgical level.  She was placed in a dorsal supine position with a 15-degree left lateral wedge.  Foley catheter was in place and she has prepped and draped in the [facility] protocol.  After testing for adequate epidural anesthesia, skin was entered through a Pfannenstiel incision and dissection was carried out in layers to the peritoneum.  Peritoneum was  incised and extended superiorly and inferiorly.  Vesicouterine peritoneum was taken down cephalad laterally.  The bladder flap was developed and the bladder blade was placed.  Uterus was incised in a low transverse manner and the uterine cavity was entered bluntly with a hemostat.  The uterine incision was extended cephalad laterally with fingers.  Vertex was elevated into the incision.  Vacuum extractor was used with 1 simple pull to lift the vertex through the incision with no pop offs.  Remainder of baby was then delivered.  Good cry and tone was noted.  Cord was clamped and cut and the baby was handed to awaiting Pediatrics Team.  Placenta was manually delivered.  Uterine cavity was clean.  Uterus was closed in 2 running locking imbricating layers of 0 Monocryl suture which achieves good hemostasis.  Anterior peritoneum was closed in a running fashion with 0 Monocryl suture which was also used to reapproximate the pyramidalis muscle in the midline.  Anterior rectus fascia was closed in a running fashion with a 0 looped PDS suture.  Skin was closed with clips.  All sponge, instrument, and needle counts were correct, and the patient was transferred to the recovery room in stable condition.     Guy Sandifer Henderson Cloud, M.D.     JET/MEDQ  D:  03/21/2012  T:  03/22/2012  Job:  [id number]

## 2012-03-22 NOTE — Progress Notes (Signed)
Patient was referred for history of depression/anxiety.  * Referral screened out by Clinical Social Worker because none of the following criteria appear to apply:  ~ History of anxiety/depression during this pregnancy, or of post-partum depression.  ~ Diagnosis of anxiety and/or depression within last 3 years  ~ History of depression due to pregnancy loss/loss of child  OR  * Patient's symptoms currently being treated with medication and/or therapy.  Please contact the Clinical Social Worker if needs arise, or by the patient's request.  Anxious feelings an issues, "years ago," as per pt.

## 2012-03-22 NOTE — Progress Notes (Signed)
Subjective: Postpartum Day 1: Cesarean Delivery Patient reports tolerating PO.    Objective: Vital signs in last 24 hours: Temp:  [97.8 F (36.6 C)-99.1 F (37.3 C)] 98.5 F (36.9 C) (11/13 0650) Pulse Rate:  [63-170] 81  (11/13 0650) Resp:  [15-27] 20  (11/13 0650) BP: (76-153)/(36-101) 110/72 mmHg (11/13 0650) SpO2:  [94 %-100 %] 96 % (11/13 0650)  Physical Exam:  General: alert and cooperative Lochia: appropriate Uterine Fundus: firm Incision: abd dressing CDI DVT Evaluation: No evidence of DVT seen on physical exam. Calf/Ankle edema is present. BS+  Basename 03/22/12 0526 03/21/12 2025  HGB 9.9* 11.3*  HCT 29.6* 34.0*    Assessment/Plan: Status post Cesarean section. Doing well postoperatively.  Continue current care.  Kordelia Severin G 03/22/2012, 8:25 AM

## 2012-03-22 NOTE — Addendum Note (Signed)
Addendum  created 03/22/12 0909 by Gertie Fey, CRNA   Modules edited:Notes Section

## 2012-03-22 NOTE — Anesthesia Postprocedure Evaluation (Signed)
  Anesthesia Post-op Note  Patient: [name]  Procedure(s) Performed: Procedure(s) (LRB) with comments: CESAREAN SECTION (N/A)  Patient Location: PACU and Mother/Baby  Anesthesia Type:Epidural  Level of Consciousness: awake, alert  and oriented  Airway and Oxygen Therapy: Patient Spontanous Breathing  Post-op Pain: none  Post-op Assessment: Post-op Vital signs reviewed  Post-op Vital Signs: Reviewed and stable  Complications: No apparent anesthesia complications

## 2012-03-23 MED ORDER — OXYCODONE-ACETAMINOPHEN 5-325 MG PO TABS: 1.0000 | ORAL_TABLET | ORAL | Status: DC | PRN

## 2012-03-23 MED ORDER — IBUPROFEN 600 MG PO TABS: 600.0000 mg | ORAL_TABLET | Freq: Four times a day (QID) | ORAL | Status: DC

## 2012-03-23 NOTE — Discharge Summary (Signed)
Obstetric Discharge Summary Reason for Admission: induction of labor Prenatal Procedures: ultrasound Intrapartum Procedures: cesarean: low cervical, transverse Postpartum Procedures: none Complications-Operative and Postpartum: none Hemoglobin  Date Value Range Status  03/22/2012 9.9* 12.0 - 15.0 g/dL Final     HCT  Date Value Range Status  03/22/2012 29.6* 36.0 - 46.0 % Final    Physical Exam:  General: alert and cooperative Lochia: appropriate Uterine Fundus: firm Incision: healing well, staples intact DVT Evaluation: No evidence of DVT seen on physical exam. No significant calf/ankle edema.  Discharge Diagnoses: Term Pregnancy-delivered  Discharge Information: Date: 03/23/2012 Activity: pelvic rest Diet: routine Medications: PNV, Ibuprofen and Percocet Condition: stable Instructions: refer to practice specific booklet Discharge to: home   Newborn Data: Live born female  Birth Weight: 9 lb 7 oz (4281 g) APGAR: 9, 9  Home with mother.  CURTIS,CAROL G 03/23/2012, 8:09 AM

## 2012-03-24 LAB — TYPE AND SCREEN
ABO/RH(D): O POS
Antibody Screen: NEGATIVE
Unit division: 0

## 2012-03-26 ENCOUNTER — Inpatient Hospital Stay (HOSPITAL_COMMUNITY)
Admission: AD | Admit: 2012-03-26 | Discharge: 2012-03-26 | Disposition: A | Discharge status: Home / Self Care | Payer: Federal, State, Local not specified - PPO | Source: Ambulatory Visit | Attending: Obstetrics and Gynecology | Admitting: Obstetrics and Gynecology

## 2012-03-26 DIAGNOSIS — T391X5A Adverse effect of 4-Aminophenol derivatives, initial encounter: Secondary | ICD-10-CM | POA: Insufficient documentation

## 2012-03-26 DIAGNOSIS — O1205 Gestational edema, complicating the puerperium: Secondary | ICD-10-CM

## 2012-03-26 DIAGNOSIS — R300 Dysuria: Secondary | ICD-10-CM

## 2012-03-26 DIAGNOSIS — T7840XA Allergy, unspecified, initial encounter: Secondary | ICD-10-CM

## 2012-03-26 DIAGNOSIS — IMO0002 Reserved for concepts with insufficient information to code with codable children: Secondary | ICD-10-CM | POA: Insufficient documentation

## 2012-03-26 LAB — URINALYSIS, ROUTINE W REFLEX MICROSCOPIC
Leukocytes, UA: NEGATIVE
Nitrite: NEGATIVE
Specific Gravity, Urine: <1.005 — ABNORMAL LOW (ref 1.005–1.030)
Urobilinogen, UA: 0.2 mg/dL (ref 0.0–1.0)
pH: 6.5 (ref 5.0–8.0)

## 2012-03-26 LAB — URINE MICROSCOPIC-ADD ON

## 2012-03-26 MED ORDER — HYDROCORTISONE 1 % EX CREA
TOPICAL_CREAM | Freq: Two times a day (BID) | CUTANEOUS | Status: DC
  Filled 2012-03-26: qty 28

## 2012-03-26 MED ORDER — HYDROCORTISONE VALERATE 0.2 % EX CREA: TOPICAL_CREAM | Freq: Two times a day (BID) | CUTANEOUS | Status: DC

## 2012-03-26 MED ORDER — FUROSEMIDE 20 MG PO TABS
10.0000 mg | ORAL_TABLET | Freq: Once | ORAL | Status: AC
  Administered 2012-03-26: 10 mg via ORAL
  Filled 2012-03-26: qty 0.5

## 2012-03-26 MED ORDER — IBUPROFEN 800 MG PO TABS
800.0000 mg | ORAL_TABLET | Freq: Once | ORAL | Status: AC
  Administered 2012-03-26: 800 mg via ORAL
  Filled 2012-03-26 (×2): qty 1

## 2012-03-26 NOTE — MAU Provider Note (Signed)
History     CSN: [id number]  Arrival date and time: 03/26/12 1405   First Provider Initiated Contact with Patient 03/26/12 1525      Chief Complaint  Patient presents with  . Leg Swelling   HPI [name] is a 41 y.o. [id number]. She had C/S 11/12, presents with c/o swelling in her legs. She had had swelling in feet and ankes at end of pregnancy, now her legs are swollen too. She is red and irriated on abd and back, itchy. Last Ibuprofen at 6am, Percocet at 10am- needs pain med.  Is pumping and nursing- nipples very tender, bruised. Last pump bloody.  Had discomfort with cath for urine spec, had had same discomfort when sat on toilet to void before came to hospital. No urgency, frequency.    Past Medical History  Diagnosis Date  . Gestational diabetes     previous pregnancy  . PONV (postoperative nausea and vomiting)   . H/O varicella   . Cancer     skin  . AMA (advanced maternal age) multigravida 35+   . Anxiety     Past Surgical History  Procedure Date  . Wisdom tooth extraction   . Gynecologic cryosurgery   . Melanoma excision 2005    L arm  . Cesarean section 03/21/2012    Procedure: CESAREAN SECTION;  Surgeon: Leslie Andrea, MD;  Location: [facility]S;  Service: Obstetrics;  Laterality: N/A;    Family History  Problem Relation Age of Onset  . Other Neg Hx   . Depression Mother   . Hypertension Father   . Diabetes Father   . COPD Maternal Grandmother   . Cancer Maternal Grandmother   . COPD Paternal Grandmother   . Heart attack Paternal Grandfather     History  Substance Use Topics  . Smoking status: Never Smoker   . Smokeless tobacco: Never Used  . Alcohol Use: No    Allergies:  Allergies  Allergen Reactions  . Erythromycin Nausea And Vomiting    Prescriptions prior to admission  Medication Sig Dispense Refill  . ibuprofen (ADVIL,MOTRIN) 600 MG tablet Take 1 tablet (600 mg total) by mouth every 6 (six) hours.  30 tablet  1  .  oxyCODONE-acetaminophen (PERCOCET/ROXICET) 5-325 MG per tablet Take 1-2 tablets by mouth every 4 (four) hours as needed (moderate - severe pain).  30 tablet  0  . Prenatal Vit-Fe Fumarate-FA (PRENATAL MULTIVITAMIN) TABS Take 1 tablet by mouth at bedtime.        Review of Systems  Constitutional: Negative for fever and chills.  Gastrointestinal: Positive for abdominal pain. Negative for nausea, vomiting, diarrhea and constipation.  Genitourinary: Positive for dysuria. Negative for urgency and frequency.  Skin: Positive for itching and rash.  Neurological: Negative for headaches.   Physical Exam   Blood pressure 140/74, pulse 80, temperature 98.3 F (36.8 C), temperature source Oral, resp. rate 18, height 5\' 4"  (1.626 m), weight 237 lb 9.6 oz (107.775 kg), currently breastfeeding.  Physical Exam  Constitutional: She is oriented to person, place, and time. She appears well-developed and well-nourished.  GI: Soft. She exhibits no distension. There is no tenderness. There is no rebound and no guarding.       Incision clean, dry and intact. Staples in place.   Musculoskeletal: Normal range of motion.  Neurological: She is alert and oriented to person, place, and time.  Skin: Rash noted. There is erythema.       On abd, back, legs. 2+  edema low legs  Psychiatric: She has a normal mood and affect. Her behavior is normal.    MAU Course  Procedures  MD Results for orders placed during the hospital encounter of 03/26/12 (from the past 24 hour(s))  URINALYSIS, ROUTINE W REFLEX MICROSCOPIC     Status: Abnormal   Collection Time   03/26/12  2:27 PM      Component Value Range   Color, Urine YELLOW  YELLOW   APPearance CLEAR  CLEAR   Specific Gravity, Urine <1.005 (*) 1.005 - 1.030   pH 6.5  5.0 - 8.0   Glucose, UA NEGATIVE  NEGATIVE mg/dL   Hgb urine dipstick LARGE (*) NEGATIVE   Bilirubin Urine NEGATIVE  NEGATIVE   Ketones, ur NEGATIVE  NEGATIVE mg/dL   Protein, ur NEGATIVE  NEGATIVE  mg/dL   Urobilinogen, UA 0.2  0.0 - 1.0 mg/dL   Nitrite NEGATIVE  NEGATIVE   Leukocytes, UA NEGATIVE  NEGATIVE  URINE MICROSCOPIC-ADD ON     Status: Abnormal   Collection Time   03/26/12  2:27 PM      Component Value Range   Squamous Epithelial / LPF FEW (*) RARE   WBC, UA 0-2  <3 WBC/hpf   RBC / HPF 11-20  <3 RBC/hpf    Lactation in with pt for extended time, she is feeling much better about nursing now.  Assessment and Plan   Probable allergic reaction to Vicodin   Hydrocortisone cream for itching/rash Lasix 10 mg 1 po here C&S on urine Keep appt tomorrow for incision check  Joanette Silveria, [place]. 03/26/2012, 3:57 PM

## 2012-03-26 NOTE — MAU Note (Signed)
"  I had a c-section on Tuesday 11/12.  TOday I have noticed increased edema that extends from my feet to above my knees and into my hands.  I called the after-hours nurse and she told me to come here."

## 2012-03-26 NOTE — Consult Note (Signed)
Ms Deleonardis presented to MAU for evaluation of increasing edema. While she was here she reported cracked and bleeding nipples that resulted in her stopping breast feeding last night. Mom feels like baby has been getting a shallow latch due to recessed chin/lower lip. She experienced BF, BF her 2 other children for 4 months without problems. She also reports difficulty getting the left breast to empty using her [employer]a Purely Yours Pump. With pumping today, she was able to get a few drops from the left breast, but mostly blood. She was able to get 2 oz from the right breast.   On exam, bilateral nipples have scabs, the left breast is more firm than the right, not engorged, but the left breast does have palpable nodules in the outer quadrants that are red and tender. Mom is afebrile per RN. Mom reports she last pumped between 0600-0700 this am. She requested to use a hand pump. She used this in the past and had better results than with an electric pump. Applied a warm compress to the left breast and with the hand pump, mom obtained approx 2 ml of slightly bloody breast milk from the right breast. Set up DEBP and mom pumped for 15 minutes receiving 15 ml from the left breast and 27 ml from the right. The nodules on the left breast softened and mom reports her breast feel much better. Ice pack was applied to left breast.   Mom reports her plan for now is to pump and bottle feed. Advised mom to pump every 3 hours for 15 minutes. If she has trouble with her milk letting down, apply warm moist heat for 20 minutes, pump, then apply ice for 20-30 minutes. Take Ibuprofen as directed by physician. Stressed importance of consistent pumping to protect milk supply and prevent engorgement/clogged ducts that could lead to mastitis. Sign and symptoms of mastitis reviewed and advised to call OB if this happens. Care for sore nipples reviewed. Comfort gels given with instructions. Advised mom once her nipples feel better to try to  work the baby back to the breast. OP appointment scheduled for her on Friday, 03/31/12 (our earliest appointment). Advised mom will be put her on cancellation list to come in sooner if an opening becomes available.

## 2012-03-26 NOTE — MAU Note (Signed)
Pt had c-section on 03/21/12. Reports she has noticed increase swelling in her legs hand and face. Denies any blood pressure problems during pregnancy.

## 2012-03-31 ENCOUNTER — Ambulatory Visit (HOSPITAL_COMMUNITY)
Admit: 2012-03-31 | Discharge: 2012-03-31 | Disposition: A | Discharge status: Home / Self Care | Payer: Federal, State, Local not specified - PPO | Attending: Obstetrics and Gynecology | Admitting: Obstetrics and Gynecology

## 2012-03-31 NOTE — Progress Notes (Signed)
Adult Lactation Consultation Outpatient Visit Note  Patient Name: [name]                             Baby Boy [name], DOB [birth year], now 12 days old, Birth Weight 9 lb. 7 oz.  Date of Birth: 1970-12-21 Gestational Age at Delivery: Unknown Type of Delivery: C/S  Breastfeeding History: Frequency of Breastfeeding: Mom has been pump and bottle feeding due to sore nipples Length of Feeding:  Voids: 6-8+ per day Stools: 6-8+ per day/ sometimes yellow, sometimes green, all loose  Supplementing / Method: Pumping:  Type of Pump:  Ameda Purely Yours DEBP   Frequency:  Every 3-4 hours for 10-15 minutes  Volume:  3 oz combined from both breasts  Comments: Mom is here for follow up from visit in [facility] on Sunday, 03/26/12. At that time she had cracked and bleeding nipples, with clogged milk ducts on the left breasts. She had stopped putting the baby to the breast as of 03/25/12 and has been pumping and bottle feeding due to the nipple trauma. She breast fed her other 2 children for 4 months and did not have difficulty. She is here today in an effort to get baby back to the breast. Mom reports clogged ducts have resolved, her nipples have healed. Baby [name] has been taking 2 1/2 oz of either EBM or formula via bottle and slow flow nipple every 3-4 hours. She is using Educational psychologist formula. In [facility] on 03/26/12, Mom had a large amount of generalized edema.    Consultation Evaluation: On exam today, her breasts are soft, no nodules palpable. The right nipple has healed, the left nipple has small scab. Her nipples are flat, but compressible, which is an improvement from Sunday, 03/26/12. Mom still has generalized edema, especially in her lower extremities.  Baby [name] is noted to have a recessed chin and short, anterior frenulum. He does bring his tongue to the lower gum line. Mom was attempting the latch [name] in cross cradle but was not obtaining depth with the latch. Changed her position to  football hold. Had mom massage breast and hand express, baby [name] latched after few attempts, but he cannot keep his bottom lip down due to his recessed chin which results in him slipping to the end of her nipple and the need to re-latch him. This also results in him having a small mouth at the breast instead of a wide mouth with flanged lips. Worked with Mom and baby to obtain more depth with the latch and keep lips flanged. Mom had to re-latch few times during the feeding on each breast.  Mom does not report discomfort with the latch however at the end of the feeding there is a compression line visible with nipple redness. In spite of this with the initial feeding on the right breast, Baby April transferred 30 ml after 20 minutes at the breast. On the left breast he transferred 20 ml after 18 minutes at the breast. Mom's nipples were red after the feeding with visible compression lines after nursing. Regardless of intervention, we could not get [name] to have a wide mouth, with flanged lips at the breast. We initiated a #20 nipple shield which [name] took well, he transferred 16 ml from the right breast with the nipple shield and there were not compression lines visible. Her nipple appeared less red after nursing.  Mom reported this was  more comfortable for her. Baby [name] has surpassed his birth weight at this visit.   Initial Feeding Assessment: Pre-feed Weight:  9 lb. 10.9 oz/ 4392 gm Post-feed Weight:  9 lb. 12.0 oz/ 4422 gm Amount Transferred:  30 ml Comments:  From right breast  Additional Feeding Assessment: Pre-feed Weight:   9 lb. 12.0 oz/ 4422 gm Post-feed Weight:  9 lb. 12.7 oz/ 4442 gm Amount Transferred:  20 ml Comments:  From left breast  Additional Feeding Assessment: Pre-feed Weight:  9 lb. 12.7 oz/ 4442 gm Post-feed Weight:  4458 gm Amount Transferred:  16 ml. Comments:  From right breast using #20 nipple shield  Total Breast milk Transferred this Visit: 66 ml. Total  Supplement Given: None  Additional Interventions: Advised mom to continue to breast feed [name] with feeding ques or by 3 hours from last feeding if she does not observe feeding ques. Use the #20 nipple shield if she is unable to obtain a good latch so she does not become sore as before, this means no nipple redness, no compression lines, Baby [name] has a wide mouth with lips flanged, otherwise use nipple shield. Look for breast milk in the nipple shield and listen for swallows with the feedings.  Post pump 4-6 times/day and give [name] EBM available till we follow up with her at the next visit.  If baby [name] is not satisfied from breast feeding and Mom does not have EBM available, supplement with 25-30 ml of formula.     Follow-Up Support group Tuesday, 04/04/12 OP follow up Monday, 04/10/12 at 1:00     [name] 03/31/2012, 1:31 PM

## 2012-04-10 ENCOUNTER — Ambulatory Visit (HOSPITAL_COMMUNITY): Payer: Federal, State, Local not specified - PPO | Attending: Obstetrics and Gynecology

## 2014-03-11 ENCOUNTER — Encounter (HOSPITAL_COMMUNITY): Payer: Self-pay

## 2015-01-30 ENCOUNTER — Encounter (HOSPITAL_BASED_OUTPATIENT_CLINIC_OR_DEPARTMENT_OTHER): Payer: Self-pay | Admitting: Emergency Medicine

## 2015-01-30 ENCOUNTER — Emergency Department (HOSPITAL_BASED_OUTPATIENT_CLINIC_OR_DEPARTMENT_OTHER)
Admission: EM | Admit: 2015-01-30 | Discharge: 2015-01-31 | Disposition: A | Discharge status: Home / Self Care | Payer: Federal, State, Local not specified - PPO | Attending: Emergency Medicine | Admitting: Emergency Medicine

## 2015-01-30 ENCOUNTER — Emergency Department (HOSPITAL_BASED_OUTPATIENT_CLINIC_OR_DEPARTMENT_OTHER): Payer: Federal, State, Local not specified - PPO

## 2015-01-30 DIAGNOSIS — Z8632 Personal history of gestational diabetes: Secondary | ICD-10-CM | POA: Insufficient documentation

## 2015-01-30 DIAGNOSIS — R079 Chest pain, unspecified: Secondary | ICD-10-CM | POA: Diagnosis present

## 2015-01-30 DIAGNOSIS — E663 Overweight: Secondary | ICD-10-CM | POA: Insufficient documentation

## 2015-01-30 DIAGNOSIS — Z85828 Personal history of other malignant neoplasm of skin: Secondary | ICD-10-CM | POA: Diagnosis not present

## 2015-01-30 DIAGNOSIS — R002 Palpitations: Secondary | ICD-10-CM | POA: Insufficient documentation

## 2015-01-30 DIAGNOSIS — F419 Anxiety disorder, unspecified: Secondary | ICD-10-CM | POA: Diagnosis not present

## 2015-01-30 DIAGNOSIS — R110 Nausea: Secondary | ICD-10-CM | POA: Insufficient documentation

## 2015-01-30 DIAGNOSIS — Z8619 Personal history of other infectious and parasitic diseases: Secondary | ICD-10-CM | POA: Insufficient documentation

## 2015-01-30 LAB — CBC WITH DIFFERENTIAL/PLATELET
Basophils Absolute: 0 10*3/uL (ref 0.0–0.1)
Basophils Relative: 0 %
Eosinophils Absolute: 0.1 10*3/uL (ref 0.0–0.7)
Eosinophils Relative: 2 %
HCT: 40.3 % (ref 36.0–46.0)
HEMOGLOBIN: 13.5 g/dL (ref 12.0–15.0)
LYMPHS ABS: 1.8 10*3/uL (ref 0.7–4.0)
LYMPHS PCT: 21 %
MCH: 29.8 pg (ref 26.0–34.0)
MCHC: 33.5 g/dL (ref 30.0–36.0)
MCV: 89 fL (ref 78.0–100.0)
Monocytes Absolute: 0.7 10*3/uL (ref 0.1–1.0)
Monocytes Relative: 8 %
NEUTROS PCT: 69 %
Neutro Abs: 5.8 10*3/uL (ref 1.7–7.7)
Platelets: 240 10*3/uL (ref 150–400)
RBC: 4.53 MIL/uL (ref 3.87–5.11)
RDW: 11.7 % (ref 11.5–15.5)
WBC: 8.4 10*3/uL (ref 4.0–10.5)

## 2015-01-30 MED ORDER — SODIUM CHLORIDE 0.9 % IV BOLUS (SEPSIS)
1000.0000 mL | Freq: Once | INTRAVENOUS | Status: AC
  Administered 2015-01-30: 1000 mL via INTRAVENOUS

## 2015-01-30 MED ORDER — LORAZEPAM 1 MG PO TABS
1.0000 mg | ORAL_TABLET | Freq: Once | ORAL | Status: AC
  Administered 2015-01-30: 1 mg via ORAL
  Filled 2015-01-30: qty 1

## 2015-01-30 NOTE — ED Provider Notes (Signed)
CSN: [id number]     Arrival date & time 01/30/15  2257 History  This chart was scribed for Merryl Hacker, MD by Julien Nordmann, ED Scribe. This patient was seen in room MH05/MH05 and the patient's care was started at 11:27 PM.    Chief Complaint  Patient presents with  . Chest Pain      The history is provided by the patient. No language interpreter was used.   HPI Comments: [name] is a 44 y.o. female who has a hx of gestational diabetes presents to the Emergency Department complaining of intermittent, gradual worsening chest flutters onset this evening. She notes she gets a "tight" sensation and "radiation" on the left side of her chest that makes her dizzy and light-headedness afterwards. Pt states sometimes her left arm will become very heavy and her fingers will become numb. Pt reports the episodes last a few seconds. Pt states she is currently under a lot of stress due to family reasons. She has not been eating or sleeping well the past few days. Pt notes she is currently on thyroid medication.Pt denies hx of heart disease, diabetes, HTN, high cholesterol, leg swelling, hx of blood clots, recent illness, fever. Pt is a non-smoker.   Patient reports that she has recently had a evaluation by cardiology, Dr. Wynonia Lawman including one month of Holter monitoring and a stress test that was reportedly negative.  Unable to confirm in chart.  Past Medical History  Diagnosis Date  . Gestational diabetes     previous pregnancy  . PONV (postoperative nausea and vomiting)   . H/O varicella   . Cancer     skin  . AMA (advanced maternal age) multigravida 53+   . Anxiety    Past Surgical History  Procedure Laterality Date  . Wisdom tooth extraction    . Gynecologic cryosurgery    . Melanoma excision  2005    L arm  . Cesarean section  03/21/2012    Procedure: CESAREAN SECTION;  Surgeon: Allena Katz, MD;  Location: [facility];  Service: Obstetrics;  Laterality: N/A;   Family  History  Problem Relation Age of Onset  . Other Neg Hx   . Depression Mother   . Hypertension Father   . Diabetes Father   . COPD Maternal Grandmother   . Cancer Maternal Grandmother   . COPD Paternal Grandmother   . Heart attack Paternal Grandfather    Social History  Substance Use Topics  . Smoking status: Never Smoker   . Smokeless tobacco: Never Used  . Alcohol Use: No   OB History    Gravida Para Term Preterm AB TAB SAB Ectopic Multiple Living   3 3 3       3      Review of Systems  Constitutional: Negative for fever.  Cardiovascular: Negative for leg swelling.       Chest flutters  Gastrointestinal: Positive for nausea. Negative for vomiting, abdominal pain and diarrhea.  Genitourinary: Negative for dysuria.  Neurological: Positive for dizziness and light-headedness.  All other systems reviewed and are negative.     Allergies  Erythromycin  Home Medications   Prior to Admission medications   Medication Sig Start Date End Date Taking? Authorizing Provider  ibuprofen (ADVIL,MOTRIN) 600 MG tablet Take 1 tablet (600 mg total) by mouth every 6 (six) hours. 03/23/12   Juanda Chance, NP  LORazepam (ATIVAN) 1 MG tablet Take 1 tablet (1 mg total) by mouth every 8 (eight) hours as  needed for anxiety. 01/31/15   Merryl Hacker, MD  Prenatal Vit-Fe Fumarate-FA (PRENATAL MULTIVITAMIN) TABS Take 1 tablet by mouth at bedtime.    Historical Provider, MD   Triage vitals: BP 146/91 mmHg  Pulse 89  Temp(Src) 99 F (37.2 C) (Oral)  Resp 20  Ht 5\' 4"  (1.626 m)  Wt 220 lb (99.791 kg)  BMI 37.74 kg/m2  SpO2 100%  LMP 01/16/2015 Physical Exam  Constitutional: She is oriented to person, place, and time. She appears well-developed and well-nourished.  Overweight  HENT:  Head: Normocephalic and atraumatic.  Cardiovascular: Normal rate, regular rhythm and normal heart sounds.   No murmur heard. Pulmonary/Chest: Effort normal and breath sounds normal. No respiratory distress.  She has no wheezes.  Abdominal: Soft. Bowel sounds are normal. There is no tenderness. There is no rebound and no guarding.  Musculoskeletal: She exhibits no edema.  Neurological: She is alert and oriented to person, place, and time.  Skin: Skin is warm and dry.  Psychiatric: She has a normal mood and affect.  Nursing note and vitals reviewed.   ED Course  Procedures  DIAGNOSTIC STUDIES: Oxygen Saturation is 100% on RA, normal by my interpretation.  COORDINATION OF CARE:  11:34 PM Discussed treatment plan with pt at bedside and pt agreed to plan.  Labs Review Labs Reviewed  BASIC METABOLIC PANEL - Abnormal; Notable for the following:    Glucose, Bld 130 (*)    All other components within normal limits  CBC WITH DIFFERENTIAL/PLATELET  TROPONIN I  TSH    Imaging Review Dg Chest 2 View  01/31/2015   CLINICAL DATA:  Chest pain and palpitations, nausea, sweats, shortness of breath. Sudden onset tonight. Similar episode 1 week ago.  EXAM: CHEST  2 VIEW  COMPARISON:  12/27/2010  FINDINGS: The heart size and mediastinal contours are within normal limits. Both lungs are clear. The visualized skeletal structures are unremarkable.  IMPRESSION: No active cardiopulmonary disease.   Electronically Signed   By: Lucienne Capers M.D.   On: 01/31/2015 01:09   I have personally reviewed and evaluated these images and lab results as part of my medical decision-making.   EKG Interpretation   Date/Time:  Thursday January 30 2015 23:06:00 EDT Ventricular Rate:  76 PR Interval:  120 QRS Duration: 92 QT Interval:  372 QTC Calculation: 418 R Axis:   -24 Text Interpretation:  Normal sinus rhythm Normal ECG Confirmed by DELO   MD, DOUGLAS ([id number]) on 01/30/2015 11:07:18 PM      MDM   Final diagnoses:  Palpitations    Patient presents with palpitations, clammy feeling, and nausea and dizziness. Last for minutes. Nontoxic on exam. Afebrile. Vital signs are reassuring. Patient has previously  had a full workup by her cardiologist including Holter monitor and stress testing which she reports is negative.  Basic labwork sentence including troponin. EKG is nonischemic. Patient is PERC neg.  patient was given Ativan given recent stressors and anxiety as well as decreased sleep and appetite. Workup is reassuring. TSH is pending. On recheck, patient denies any recurrence of symptoms and is asymptomatic. Discussed with her close follow-up with Dr. Wynonia Lawman. Will call the patient if TSH testing is abnormal. Will discharge with a short course of an Ativan for anxiety.  After history, exam, and medical workup I feel the patient has been appropriately medically screened and is safe for discharge home. Pertinent diagnoses were discussed with the patient. Patient was given return precautions.  I personally performed the services  described in this documentation, which was scribed in my presence. The recorded information has been reviewed and is accurate.    Merryl Hacker, MD 01/31/15 [phone]

## 2015-01-30 NOTE — ED Notes (Signed)
Pt staets has been having intermittent fluttering in chest. After episode she gets clammy, dizziness,SOB and has several in a short period. Pt is under a lot of stress per patient.

## 2015-01-31 LAB — BASIC METABOLIC PANEL
Anion gap: 6 (ref 5–15)
BUN: 14 mg/dL (ref 6–20)
CO2: 28 mmol/L (ref 22–32)
Calcium: 9 mg/dL (ref 8.9–10.3)
Chloride: 103 mmol/L (ref 101–111)
Creatinine, Ser: 0.87 mg/dL (ref 0.44–1.00)
GFR calc Af Amer: >60 mL/min (ref >60)
GLUCOSE: 130 mg/dL — AB (ref 65–99)
POTASSIUM: 4 mmol/L (ref 3.5–5.1)
Sodium: 137 mmol/L (ref 135–145)

## 2015-01-31 LAB — TSH: TSH: 3.937 u[IU]/mL (ref 0.350–4.500)

## 2015-01-31 LAB — TROPONIN I: Troponin I: <0.03 ng/mL (ref <0.031)

## 2015-01-31 MED ORDER — LORAZEPAM 1 MG PO TABS: 1.0000 mg | ORAL_TABLET | Freq: Three times a day (TID) | ORAL | Status: DC | PRN

## 2015-01-31 NOTE — Discharge Instructions (Signed)
You were seen today for palpitations. Your workup is reassuring. This may be related to recent stress and anxiety. However, you need to follow-up closely with her cardiologist.  Palpitations A palpitation is the feeling that your heartbeat is irregular or is faster than normal. It may feel like your heart is fluttering or skipping a beat. Palpitations are usually not a serious problem. However, in some cases, you may need further medical evaluation. CAUSES  Palpitations can be caused by:  Smoking.  Caffeine or other stimulants, such as diet pills or energy drinks.  Alcohol.  Stress and anxiety.  Strenuous physical activity.  Fatigue.  Certain medicines.  Heart disease, especially if you have a history of irregular heart rhythms (arrhythmias), such as atrial fibrillation, atrial flutter, or supraventricular tachycardia.  An improperly working pacemaker or defibrillator. DIAGNOSIS  To find the cause of your palpitations, your health care provider will take your medical history and perform a physical exam. Your health care provider may also have you take a test called an ambulatory electrocardiogram (ECG). An ECG records your heartbeat patterns over a 24-hour period. You may also have other tests, such as:  Transthoracic echocardiogram (TTE). During echocardiography, sound waves are used to evaluate how blood flows through your heart.  Transesophageal echocardiogram (TEE).  Cardiac monitoring. This allows your health care provider to monitor your heart rate and rhythm in real time.  Holter monitor. This is a portable device that records your heartbeat and can help diagnose heart arrhythmias. It allows your health care provider to track your heart activity for several days, if needed.  Stress tests by exercise or by giving medicine that makes the heart beat faster. TREATMENT  Treatment of palpitations depends on the cause of your symptoms and can vary greatly. Most cases of  palpitations do not require any treatment other than time, relaxation, and monitoring your symptoms. Other causes, such as atrial fibrillation, atrial flutter, or supraventricular tachycardia, usually require further treatment. HOME CARE INSTRUCTIONS   Avoid:  Caffeinated coffee, tea, soft drinks, diet pills, and energy drinks.  Chocolate.  Alcohol.  Stop smoking if you smoke.  Reduce your stress and anxiety. Things that can help you relax include:  A method of controlling things in your body, such as your heartbeats, with your mind (biofeedback).  Yoga.  Meditation.  Physical activity such as swimming, jogging, or walking.  Get plenty of rest and sleep. SEEK MEDICAL CARE IF:   You continue to have a fast or irregular heartbeat beyond 24 hours.  Your palpitations occur more often. SEEK IMMEDIATE MEDICAL CARE IF:  You have chest pain or shortness of breath.  You have a severe headache.  You feel dizzy or you faint. MAKE SURE YOU:  Understand these instructions.  Will watch your condition.  Will get help right away if you are not doing well or get worse. Document Released: 04/23/2000 Document Revised: 05/01/2013 Document Reviewed: 06/25/2011 [facility] Patient Information 2015 [place], [place]. This information is not intended to replace advice given to you by your health care provider. Make sure you discuss any questions you have with your health care provider.

## 2015-01-31 NOTE — ED Notes (Signed)
Patient transported to X-ray 

## 2015-02-18 ENCOUNTER — Telehealth: Payer: Self-pay | Admitting: Cardiovascular Disease

## 2015-02-18 NOTE — Telephone Encounter (Signed)
Received records from [place] for appointment on 03/05/15 with Dr Oval Linsey.  Records given to [facility] (medical records) for Dr Blenda Mounts schedule on 10/26.16. lp

## 2015-03-05 ENCOUNTER — Ambulatory Visit: Payer: Federal, State, Local not specified - PPO | Admitting: Cardiovascular Disease

## 2015-03-31 ENCOUNTER — Ambulatory Visit: Payer: Federal, State, Local not specified - PPO | Admitting: Internal Medicine

## 2015-07-29 ENCOUNTER — Encounter: Payer: Self-pay | Admitting: Internal Medicine

## 2015-07-29 ENCOUNTER — Ambulatory Visit (INDEPENDENT_AMBULATORY_CARE_PROVIDER_SITE_OTHER): Payer: Federal, State, Local not specified - PPO | Admitting: Internal Medicine

## 2015-07-29 VITALS — BP 128/74 | HR 80 | Ht 64.5 in | Wt 227.8 lb

## 2015-07-29 DIAGNOSIS — E038 Other specified hypothyroidism: Secondary | ICD-10-CM

## 2015-07-29 DIAGNOSIS — R002 Palpitations: Secondary | ICD-10-CM | POA: Diagnosis not present

## 2015-07-29 DIAGNOSIS — F419 Anxiety disorder, unspecified: Secondary | ICD-10-CM

## 2015-07-29 DIAGNOSIS — E039 Hypothyroidism, unspecified: Secondary | ICD-10-CM | POA: Insufficient documentation

## 2015-07-29 NOTE — Progress Notes (Signed)
OFFICE NOTE  Chief Complaint:  Palpitations, anxiety  Primary Care Physician: Allena Katz, MD  HPI:  [name] is a 45 y.o. female who presents today for second opinion. She has been having ongoing palpitations and what she thinks are anxiety or panic attacks for several months. Last summer her husband was hospitalized with acute MI. She was not able to stay with him in the ICU and was very upset about this. She had significant palpitations and uneasiness. She felt like she was having panic. She described perioral numbness and tingling in her fingers at times. Her heart was racing and she was short of breath. She finds that this happens at night as well and wakes her up. She did seek out a local cardiologist, Dr. Tollie Eth, who performed a stress test, echocardiogram and arrange for a monitor. Per her report these were all negative. I will obtain those records and reviewed them personally. Currently she reports she continues to have palpitations. She continues to be bothered by anxiety. She notes that some of her symptoms are worse on a cyclical basis which seems to coincide with her periods. Her mother apparently underwent menopause in her 43s, but I suggested that some of these symptoms may be early menopausal symptoms. She describes flushing and feeling hot like she was sunburned during these episodes.  PMHx:  Past Medical History  Diagnosis Date  . Gestational diabetes     previous pregnancy  . PONV (postoperative nausea and vomiting)   . H/O varicella   . Cancer ([place])     skin  . AMA (advanced maternal age) multigravida 15+   . Anxiety     Past Surgical History  Procedure Laterality Date  . Wisdom tooth extraction    . Gynecologic cryosurgery    . Melanoma excision  2005    L arm  . Cesarean section  03/21/2012    Procedure: CESAREAN SECTION;  Surgeon: Allena Katz, MD;  Location: [facility];  Service: Obstetrics;  Laterality: N/A;    FAMHx:  Family  History  Problem Relation Age of Onset  . Other Neg Hx   . Depression Mother   . Hypertension Father   . Diabetes Father   . COPD Maternal Grandmother   . Cancer Maternal Grandmother   . COPD Paternal Grandmother   . Heart attack Paternal Grandfather     SOCHx:   reports that she has never smoked. She has never used smokeless tobacco. She reports that she does not drink alcohol or use illicit drugs.  ALLERGIES:  Allergies  Allergen Reactions  . Erythromycin Nausea And Vomiting  . Augmentin [Amoxicillin-Pot Clavulanate] Anxiety    "panic attack"    ROS: Pertinent items noted in HPI and remainder of comprehensive ROS otherwise negative.  HOME MEDS: Current Outpatient Prescriptions  Medication Sig Dispense Refill  . buPROPion (WELLBUTRIN XL) 150 MG 24 hr tablet Take 150 mg by mouth every morning.  5  . levothyroxine (SYNTHROID, LEVOTHROID) 50 MCG tablet Take 50 mcg by mouth daily.  0  . Multiple Vitamin (MULTIVITAMIN) tablet Take 1 tablet by mouth daily.     No current facility-administered medications for this visit.    LABS/IMAGING: No results found for this or any previous visit (from the past 48 hour(s)). No results found.  WEIGHTS: Wt Readings from Last 3 Encounters:  07/29/15 227 lb 12.8 oz (103.329 kg)  01/30/15 220 lb (99.791 kg)  03/26/12 237 lb 9.6 oz (107.775 kg)  VITALS: BP 128/74 mmHg  Pulse 80  Ht 5' 4.5" (1.638 m)  Wt 227 lb 12.8 oz (103.329 kg)  BMI 38.51 kg/m2  EXAM: General appearance: alert and no distress Neck: no carotid bruit, no JVD and thyroid not enlarged, symmetric, no tenderness/mass/nodules Lungs: clear to auscultation bilaterally Heart: regular rate and rhythm, S1, S2 normal, no murmur, click, rub or gallop Abdomen: soft, non-tender; bowel sounds normal; no masses,  no organomegaly Extremities: extremities normal, atraumatic, no cyanosis or edema Pulses: 2+ and symmetric Skin: Skin color, texture, turgor normal. No rashes or  lesions Neurologic: Grossly normal Psych: Fairly anxious  EKG: Normal sinus rhythm at 80  ASSESSMENT: 1. Palpitations 2. Anxiety/panic disorder 3. Possible perimenopausal symptoms  PLAN: 1.   Mrs. Colligan is describing palpitations which I think are related to anxiety and panic. Her events are episodic and sometimes worse in a cyclic manner associated with periods. It may be helpful for her to reach out to her OB/GYN or PCP to do testing to see if she's perimenopausal. She might benefit from treatment for hot flashes or even an over-the-counter supplement such as black cohosh. I suspect anxiety is playing a big role in this. She is on Wellbutrin, however there are number of other alternatives that may be more effective. She might benefit from a psychiatry referral. She does note some PTSD symptoms, possibly related to her husband's hospitalization for acute MI. I will review the 2016 records from Dr. Thurman Coyer office. If they are all negative as I suspect and that she was told, then no further cardiac workup really is necessary. Ultimately, she could consider a beta blocker to see if it helps some of her symptoms of anxiety and panic.  Pixie Casino, MD, [facility] Attending Cardiologist Laurium C Alaisa Moffitt 07/29/2015, 6:48 PM

## 2015-07-29 NOTE — Patient Instructions (Signed)
Follow up with your OB-GYN  Your physician recommends that you schedule a follow-up appointment as needed.   We will request records from Dr. Wynonia Lawman

## 2015-07-30 ENCOUNTER — Telehealth: Payer: Self-pay | Admitting: Internal Medicine

## 2015-07-30 ENCOUNTER — Encounter: Payer: Self-pay | Admitting: *Deleted

## 2015-07-30 NOTE — Telephone Encounter (Signed)
Faxed Release signed by patient to Dr Tollie Eth to obtain records as requested by Dr Debara Pickett.  Faxed on 07/30/15. lp

## 2015-08-20 DIAGNOSIS — R635 Abnormal weight gain: Secondary | ICD-10-CM | POA: Diagnosis not present

## 2015-08-20 DIAGNOSIS — N951 Menopausal and female climacteric states: Secondary | ICD-10-CM | POA: Diagnosis not present

## 2015-08-27 DIAGNOSIS — R7309 Other abnormal glucose: Secondary | ICD-10-CM | POA: Diagnosis not present

## 2015-08-27 DIAGNOSIS — D485 Neoplasm of uncertain behavior of skin: Secondary | ICD-10-CM | POA: Diagnosis not present

## 2015-08-27 DIAGNOSIS — Z85820 Personal history of malignant melanoma of skin: Secondary | ICD-10-CM | POA: Diagnosis not present

## 2015-08-27 DIAGNOSIS — D225 Melanocytic nevi of trunk: Secondary | ICD-10-CM | POA: Diagnosis not present

## 2015-08-27 DIAGNOSIS — D2262 Melanocytic nevi of left upper limb, including shoulder: Secondary | ICD-10-CM | POA: Diagnosis not present

## 2015-08-27 DIAGNOSIS — Z86018 Personal history of other benign neoplasm: Secondary | ICD-10-CM | POA: Diagnosis not present

## 2015-08-27 DIAGNOSIS — E559 Vitamin D deficiency, unspecified: Secondary | ICD-10-CM | POA: Diagnosis not present

## 2015-08-27 DIAGNOSIS — R790 Abnormal level of blood mineral: Secondary | ICD-10-CM | POA: Diagnosis not present

## 2015-08-27 DIAGNOSIS — E7800 Pure hypercholesterolemia, unspecified: Secondary | ICD-10-CM | POA: Diagnosis not present

## 2015-09-03 DIAGNOSIS — R7309 Other abnormal glucose: Secondary | ICD-10-CM | POA: Diagnosis not present

## 2015-09-03 DIAGNOSIS — E538 Deficiency of other specified B group vitamins: Secondary | ICD-10-CM | POA: Diagnosis not present

## 2015-09-03 DIAGNOSIS — E7800 Pure hypercholesterolemia, unspecified: Secondary | ICD-10-CM | POA: Diagnosis not present

## 2015-09-03 DIAGNOSIS — E559 Vitamin D deficiency, unspecified: Secondary | ICD-10-CM | POA: Diagnosis not present

## 2015-09-11 DIAGNOSIS — E538 Deficiency of other specified B group vitamins: Secondary | ICD-10-CM | POA: Diagnosis not present

## 2015-09-11 DIAGNOSIS — R7309 Other abnormal glucose: Secondary | ICD-10-CM | POA: Diagnosis not present

## 2015-09-11 DIAGNOSIS — E7800 Pure hypercholesterolemia, unspecified: Secondary | ICD-10-CM | POA: Diagnosis not present

## 2015-09-11 DIAGNOSIS — E559 Vitamin D deficiency, unspecified: Secondary | ICD-10-CM | POA: Diagnosis not present

## 2015-09-17 DIAGNOSIS — K080 Exfoliation of teeth due to systemic causes: Secondary | ICD-10-CM | POA: Diagnosis not present

## 2015-09-17 DIAGNOSIS — E669 Obesity, unspecified: Secondary | ICD-10-CM | POA: Diagnosis not present

## 2015-09-17 DIAGNOSIS — E538 Deficiency of other specified B group vitamins: Secondary | ICD-10-CM | POA: Diagnosis not present

## 2015-09-17 DIAGNOSIS — E7800 Pure hypercholesterolemia, unspecified: Secondary | ICD-10-CM | POA: Diagnosis not present

## 2015-09-17 DIAGNOSIS — R7309 Other abnormal glucose: Secondary | ICD-10-CM | POA: Diagnosis not present

## 2015-09-24 DIAGNOSIS — R7309 Other abnormal glucose: Secondary | ICD-10-CM | POA: Diagnosis not present

## 2015-09-24 DIAGNOSIS — E538 Deficiency of other specified B group vitamins: Secondary | ICD-10-CM | POA: Diagnosis not present

## 2015-09-24 DIAGNOSIS — E669 Obesity, unspecified: Secondary | ICD-10-CM | POA: Diagnosis not present

## 2015-09-24 DIAGNOSIS — E7800 Pure hypercholesterolemia, unspecified: Secondary | ICD-10-CM | POA: Diagnosis not present

## 2015-10-08 DIAGNOSIS — R7309 Other abnormal glucose: Secondary | ICD-10-CM | POA: Diagnosis not present

## 2015-10-08 DIAGNOSIS — E7800 Pure hypercholesterolemia, unspecified: Secondary | ICD-10-CM | POA: Diagnosis not present

## 2015-10-08 DIAGNOSIS — E669 Obesity, unspecified: Secondary | ICD-10-CM | POA: Diagnosis not present

## 2015-10-08 DIAGNOSIS — E538 Deficiency of other specified B group vitamins: Secondary | ICD-10-CM | POA: Diagnosis not present

## 2015-10-17 DIAGNOSIS — N390 Urinary tract infection, site not specified: Secondary | ICD-10-CM | POA: Diagnosis not present

## 2015-11-03 DIAGNOSIS — L989 Disorder of the skin and subcutaneous tissue, unspecified: Secondary | ICD-10-CM | POA: Diagnosis not present

## 2015-11-03 DIAGNOSIS — E6609 Other obesity due to excess calories: Secondary | ICD-10-CM | POA: Diagnosis not present

## 2015-11-03 DIAGNOSIS — F419 Anxiety disorder, unspecified: Secondary | ICD-10-CM | POA: Diagnosis not present

## 2016-01-27 DIAGNOSIS — R229 Localized swelling, mass and lump, unspecified: Secondary | ICD-10-CM | POA: Diagnosis not present

## 2016-03-01 DIAGNOSIS — Z23 Encounter for immunization: Secondary | ICD-10-CM | POA: Diagnosis not present

## 2016-03-01 DIAGNOSIS — E6609 Other obesity due to excess calories: Secondary | ICD-10-CM | POA: Diagnosis not present

## 2016-03-01 DIAGNOSIS — F419 Anxiety disorder, unspecified: Secondary | ICD-10-CM | POA: Diagnosis not present

## 2016-04-28 DIAGNOSIS — K080 Exfoliation of teeth due to systemic causes: Secondary | ICD-10-CM | POA: Diagnosis not present

## 2016-06-28 DIAGNOSIS — E039 Hypothyroidism, unspecified: Secondary | ICD-10-CM | POA: Diagnosis not present

## 2016-06-28 DIAGNOSIS — E6609 Other obesity due to excess calories: Secondary | ICD-10-CM | POA: Diagnosis not present

## 2016-06-28 DIAGNOSIS — F419 Anxiety disorder, unspecified: Secondary | ICD-10-CM | POA: Diagnosis not present

## 2016-06-28 DIAGNOSIS — Z8632 Personal history of gestational diabetes: Secondary | ICD-10-CM | POA: Diagnosis not present

## 2016-07-14 DIAGNOSIS — Z6836 Body mass index (BMI) 36.0-36.9, adult: Secondary | ICD-10-CM | POA: Diagnosis not present

## 2016-07-14 DIAGNOSIS — Z01419 Encounter for gynecological examination (general) (routine) without abnormal findings: Secondary | ICD-10-CM | POA: Diagnosis not present

## 2016-07-14 DIAGNOSIS — Z1231 Encounter for screening mammogram for malignant neoplasm of breast: Secondary | ICD-10-CM | POA: Diagnosis not present

## 2016-09-13 DIAGNOSIS — J0100 Acute maxillary sinusitis, unspecified: Secondary | ICD-10-CM | POA: Diagnosis not present

## 2016-10-02 DIAGNOSIS — M533 Sacrococcygeal disorders, not elsewhere classified: Secondary | ICD-10-CM | POA: Diagnosis not present

## 2016-10-02 DIAGNOSIS — M545 Low back pain: Secondary | ICD-10-CM | POA: Diagnosis not present

## 2016-10-07 DIAGNOSIS — N926 Irregular menstruation, unspecified: Secondary | ICD-10-CM | POA: Diagnosis not present

## 2016-10-07 DIAGNOSIS — Z3169 Encounter for other general counseling and advice on procreation: Secondary | ICD-10-CM | POA: Diagnosis not present

## 2016-10-15 DIAGNOSIS — M545 Low back pain: Secondary | ICD-10-CM | POA: Diagnosis not present

## 2016-10-20 DIAGNOSIS — M545 Low back pain: Secondary | ICD-10-CM | POA: Diagnosis not present

## 2016-11-01 DIAGNOSIS — Z85820 Personal history of malignant melanoma of skin: Secondary | ICD-10-CM | POA: Diagnosis not present

## 2016-11-01 DIAGNOSIS — Z86018 Personal history of other benign neoplasm: Secondary | ICD-10-CM | POA: Diagnosis not present

## 2016-11-01 DIAGNOSIS — D225 Melanocytic nevi of trunk: Secondary | ICD-10-CM | POA: Diagnosis not present

## 2016-11-01 DIAGNOSIS — L814 Other melanin hyperpigmentation: Secondary | ICD-10-CM | POA: Diagnosis not present

## 2016-11-03 DIAGNOSIS — K080 Exfoliation of teeth due to systemic causes: Secondary | ICD-10-CM | POA: Diagnosis not present

## 2016-11-08 DIAGNOSIS — E288 Other ovarian dysfunction: Secondary | ICD-10-CM | POA: Diagnosis not present

## 2016-11-26 DIAGNOSIS — Z3183 Encounter for assisted reproductive fertility procedure cycle: Secondary | ICD-10-CM | POA: Diagnosis not present

## 2016-11-26 DIAGNOSIS — N979 Female infertility, unspecified: Secondary | ICD-10-CM | POA: Diagnosis not present

## 2016-12-01 DIAGNOSIS — E2839 Other primary ovarian failure: Secondary | ICD-10-CM | POA: Diagnosis not present

## 2016-12-01 DIAGNOSIS — Z319 Encounter for procreative management, unspecified: Secondary | ICD-10-CM | POA: Diagnosis not present

## 2016-12-02 DIAGNOSIS — E2839 Other primary ovarian failure: Secondary | ICD-10-CM | POA: Diagnosis not present

## 2016-12-03 DIAGNOSIS — Z113 Encounter for screening for infections with a predominantly sexual mode of transmission: Secondary | ICD-10-CM | POA: Diagnosis not present

## 2016-12-04 DIAGNOSIS — Z3141 Encounter for fertility testing: Secondary | ICD-10-CM | POA: Diagnosis not present

## 2016-12-04 DIAGNOSIS — Z319 Encounter for procreative management, unspecified: Secondary | ICD-10-CM | POA: Diagnosis not present

## 2016-12-09 DIAGNOSIS — N8500 Endometrial hyperplasia, unspecified: Secondary | ICD-10-CM | POA: Diagnosis not present

## 2017-01-14 DIAGNOSIS — K080 Exfoliation of teeth due to systemic causes: Secondary | ICD-10-CM | POA: Diagnosis not present

## 2017-02-09 DIAGNOSIS — Z319 Encounter for procreative management, unspecified: Secondary | ICD-10-CM | POA: Diagnosis not present

## 2017-02-09 DIAGNOSIS — N979 Female infertility, unspecified: Secondary | ICD-10-CM | POA: Diagnosis not present

## 2017-03-04 DIAGNOSIS — G43809 Other migraine, not intractable, without status migrainosus: Secondary | ICD-10-CM | POA: Diagnosis not present

## 2017-03-04 DIAGNOSIS — R030 Elevated blood-pressure reading, without diagnosis of hypertension: Secondary | ICD-10-CM | POA: Diagnosis not present

## 2017-03-04 DIAGNOSIS — G510 Bell's palsy: Secondary | ICD-10-CM | POA: Diagnosis not present

## 2017-03-07 ENCOUNTER — Encounter (HOSPITAL_BASED_OUTPATIENT_CLINIC_OR_DEPARTMENT_OTHER): Payer: Self-pay

## 2017-03-07 DIAGNOSIS — R29810 Facial weakness: Secondary | ICD-10-CM | POA: Diagnosis not present

## 2017-03-07 DIAGNOSIS — G510 Bell's palsy: Secondary | ICD-10-CM | POA: Diagnosis not present

## 2017-03-07 DIAGNOSIS — Z79899 Other long term (current) drug therapy: Secondary | ICD-10-CM | POA: Insufficient documentation

## 2017-03-07 DIAGNOSIS — R51 Headache: Secondary | ICD-10-CM | POA: Insufficient documentation

## 2017-03-07 DIAGNOSIS — Z85828 Personal history of other malignant neoplasm of skin: Secondary | ICD-10-CM | POA: Insufficient documentation

## 2017-03-07 DIAGNOSIS — E039 Hypothyroidism, unspecified: Secondary | ICD-10-CM | POA: Diagnosis not present

## 2017-03-07 NOTE — ED Triage Notes (Signed)
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

## 2017-03-08 ENCOUNTER — Encounter: Payer: Self-pay | Admitting: Neurology

## 2017-03-08 ENCOUNTER — Emergency Department (HOSPITAL_BASED_OUTPATIENT_CLINIC_OR_DEPARTMENT_OTHER)
Admission: EM | Admit: 2017-03-08 | Discharge: 2017-03-08 | Disposition: A | Discharge status: Home / Self Care | Payer: Federal, State, Local not specified - PPO | Attending: Emergency Medicine | Admitting: Emergency Medicine

## 2017-03-08 DIAGNOSIS — N8501 Benign endometrial hyperplasia: Secondary | ICD-10-CM | POA: Insufficient documentation

## 2017-03-08 DIAGNOSIS — Z85820 Personal history of malignant melanoma of skin: Secondary | ICD-10-CM | POA: Insufficient documentation

## 2017-03-08 DIAGNOSIS — G510 Bell's palsy: Secondary | ICD-10-CM

## 2017-03-08 DIAGNOSIS — Z8632 Personal history of gestational diabetes: Secondary | ICD-10-CM | POA: Diagnosis present

## 2017-03-08 HISTORY — DX: Migraine, unspecified, not intractable, without status migrainosus: G43.909

## 2017-03-08 MED ORDER — LORAZEPAM 1 MG PO TABS
1.0000 mg | ORAL_TABLET | Freq: Once | ORAL | Status: AC
  Administered 2017-03-08: 1 mg via ORAL
  Filled 2017-03-08: qty 1

## 2017-03-08 NOTE — ED Notes (Signed)
ED Provider at bedside. 

## 2017-03-08 NOTE — ED Provider Notes (Signed)
[facility] DEPART[facility] Provider Note   CSN: [id number] Arrival date & time: 03/07/17  2028     History   Chief Complaint Chief Complaint  Patient presents with  . Migraine    HPI [name] is a 46 y.o. female.  HPI  This is a 46 year old female who presents with left facial droop and headache.  Patient reports onset of symptoms earlier this week.  She states that her symptoms started with a left sided headache that radiated downward into her jaw.  Denies neck pain or fevers.  She does have a history of migraines.  After that she noted tingling in her tongue and drooping of the left side of her face.  She was seen by her primary physician and ultimately given prednisone and antivirals for Bell's palsy.  She states that she intermittently has sharp pains on the left side of her temple.  She currently is without headache.  She has been referred to neurology but is very anxious regarding her symptoms as she has looked her symptoms up on the Internet.  Denies any recent illnesses.  Does report increased stress.  Denies any weakness, numbness, tingling in the upper or lower extremities.  Denies other focal weakness.  Past Medical History:  Diagnosis Date  . AMA (advanced maternal age) multigravida 54+   . Anxiety   . Cancer ([place])    skin  . Gestational diabetes    previous pregnancy  . H/O varicella   . Migraine   . PONV (postoperative nausea and vomiting)     Patient Active Problem List   Diagnosis Date Noted  . Anxiety 07/29/2015  . Palpitations 07/29/2015  . Hypothyroidism 07/29/2015    Past Surgical History:  Procedure Laterality Date  . CESAREAN SECTION  03/21/2012   Procedure: CESAREAN SECTION;  Surgeon: Allena Katz, MD;  Location: [facility];  Service: Obstetrics;  Laterality: N/A;  . GYNECOLOGIC CRYOSURGERY    . MELANOMA EXCISION  2005   L arm  . WISDOM TOOTH EXTRACTION  1994    OB History    Gravida Para Term Preterm AB Living   3 3 3     3    SAB TAB Ectopic Multiple Live Births           3       Home Medications    Prior to Admission medications   Medication Sig Start Date End Date Taking? Authorizing Provider  buPROPion (WELLBUTRIN XL) 150 MG 24 hr tablet Take 150 mg by mouth every morning. 07/25/15   [provider]  levothyroxine (SYNTHROID, LEVOTHROID) 50 MCG tablet Take 50 mcg by mouth daily. 07/25/15   [provider]  Multiple Vitamin (MULTIVITAMIN) tablet Take 1 tablet by mouth daily.    [provider]    Family History Family History  Problem Relation Age of Onset  . Depression Mother   . Hypertension Father   . Diabetes Father   . COPD Maternal Grandmother   . Cancer Maternal Grandmother   . COPD Paternal Grandmother   . Heart attack Paternal Grandfather   . Other Neg Hx     Social History Social History  Substance Use Topics  . Smoking status: Never Smoker  . Smokeless tobacco: Never Used  . Alcohol use No     Allergies   Erythromycin and Augmentin [amoxicillin-pot clavulanate]   Review of Systems Review of Systems  Constitutional: Negative for fever.  Respiratory: Negative for shortness of breath.  Cardiovascular: Negative for chest pain.  Gastrointestinal: Negative for abdominal pain, nausea and vomiting.  Musculoskeletal: Negative for neck pain and neck stiffness.  Neurological: Positive for facial asymmetry and headaches. Negative for speech difficulty, weakness and numbness.  All other systems reviewed and are negative.    Physical Exam Updated Vital Signs BP (!) 153/89 (BP Location: Right Arm)   Pulse 71   Temp 99.6 F (37.6 C) (Oral)   Resp 16   Wt 99.1 kg (218 lb 7.6 oz)   LMP 02/22/2017   SpO2 97%   BMI 36.92 kg/m   Physical Exam  Constitutional: She is oriented to person, place, and time. She appears well-developed and well-nourished.  Anxious appearing  HENT:  Head: Normocephalic and atraumatic.  Left facial droop with  down turning of the lips, flattened nasolabial fold, inability to close eye fully, and involvement of the forehead  Eyes: Pupils are equal, round, and reactive to light.  Neck: Neck supple.  Cardiovascular: Normal rate, regular rhythm and normal heart sounds.   No murmur heard. Pulmonary/Chest: Effort normal and breath sounds normal. No respiratory distress. She has no wheezes.  Abdominal: Soft. There is no tenderness.  Neurological: She is alert and oriented to person, place, and time.  Facial nerve palsy as described above, otherwise cranial nerves II through XII intact, 5 out of 5 strength in all 4 extremities, normal gait, no dysmetria to finger-nose-finger  Skin: Skin is warm and dry.  Psychiatric: She has a normal mood and affect.  Nursing note and vitals reviewed.    ED Treatments / Results  Labs (all labs ordered are listed, but only abnormal results are displayed) Labs Reviewed - No data to display  EKG  EKG Interpretation None       Radiology No results found.  Procedures Procedures (including critical care time)  Medications Ordered in ED Medications  LORazepam (ATIVAN) tablet 1 mg (1 mg Oral Given 03/08/17 0112)     Initial Impression / Assessment and Plan / ED Course  I have reviewed the triage vital signs and the nursing notes.  Pertinent labs & imaging results that were available during my care of the patient were reviewed by me and considered in my medical decision making (see chart for details).     Patient presents with persistent left facial droop and is concerned about her diagnosis of Bell's palsy.  Her initial symptoms started out with a headache; however, she no longer has a headache.  She has classic Bell's palsy on exam with no other neurologic deficit.  Patient was reassured that her treatment is appropriate.  Continue Lacri-Lube, prednisone, and antivirals.  I also discussed with the patient that the duration of her symptoms may be prolonged.   She was given 1 mg of Ativan for anxiety.  Given that she no longer has a headache and is otherwise nonfocal on exam, do not feel she needs imaging at this time.  She was referred to neurology.  After history, exam, and medical workup I feel the patient has been appropriately medically screened and is safe for discharge home. Pertinent diagnoses were discussed with the patient. Patient was given return precautions.   Final Clinical Impressions(s) / ED Diagnoses   Final diagnoses:  Bell's palsy    New Prescriptions New Prescriptions   No medications on file     Merryl Hacker, MD 03/08/17 [phone]

## 2017-03-08 NOTE — Discharge Instructions (Signed)
Continue antivirals, prednisone, lubrication of the left eye.  Follow-up with neurology as needed.

## 2017-03-09 DIAGNOSIS — G510 Bell's palsy: Secondary | ICD-10-CM | POA: Diagnosis not present

## 2017-03-09 DIAGNOSIS — R200 Anesthesia of skin: Secondary | ICD-10-CM | POA: Diagnosis not present

## 2017-03-11 DIAGNOSIS — G510 Bell's palsy: Secondary | ICD-10-CM | POA: Diagnosis not present

## 2017-03-11 DIAGNOSIS — G43009 Migraine without aura, not intractable, without status migrainosus: Secondary | ICD-10-CM | POA: Diagnosis not present

## 2017-03-11 DIAGNOSIS — G43109 Migraine with aura, not intractable, without status migrainosus: Secondary | ICD-10-CM | POA: Insufficient documentation

## 2017-03-23 ENCOUNTER — Ambulatory Visit: Payer: Federal, State, Local not specified - PPO | Admitting: Neurology

## 2017-04-14 ENCOUNTER — Ambulatory Visit (INDEPENDENT_AMBULATORY_CARE_PROVIDER_SITE_OTHER): Payer: Federal, State, Local not specified - PPO | Admitting: Neurology

## 2017-04-14 ENCOUNTER — Encounter: Payer: Self-pay | Admitting: Neurology

## 2017-04-14 VITALS — BP 120/80 | HR 92 | Ht 64.0 in | Wt 219.2 lb

## 2017-04-14 DIAGNOSIS — G510 Bell's palsy: Secondary | ICD-10-CM

## 2017-04-14 NOTE — Progress Notes (Signed)
NEUROLOGY CONSULTATION NOTE  [name] MRN: [id number] DOB: 05/16/1970  Referring provider: Dr. Inda Castle Primary care provider: Dr. Dema Severin  Reason for consult:  Bell's palsy  HISTORY OF PRESENT ILLNESS: [name] is a 46 year old left-handed female with hypothyroidism, anxiety and history of melanoma who presents for Bell's palsy.   At the end of October, she developed a left periorbital pulsating headache that progressed over the next 4 days to the left jaw, frontal and occipital region and left ear.  She then developed decreased sense of taste.  She was prescribed prednisone and valacyclovir.  On about day 6, she woke up with paralysis of the entire left side of her face.  She noted that the left eye wouldn't close and looked bigger than the right eye. She denied rash, facial numbness, or numbness and weakness of extremities.  Over the next 2 weeks, facial weakness resolved and she regain her sense of taste.  Sometimes she still has some twitching of her eyelids.  She denied any preceding stress or viral illness.  PAST MEDICAL HISTORY: Past Medical History:  Diagnosis Date  . AMA (advanced maternal age) multigravida 75+   . Anxiety   . Cancer ([place])    skin  . Gestational diabetes    previous pregnancy  . H/O varicella   . Migraine   . PONV (postoperative nausea and vomiting)     PAST SURGICAL HISTORY: Past Surgical History:  Procedure Laterality Date  . CESAREAN SECTION  03/21/2012   Procedure: CESAREAN SECTION;  Surgeon: Allena Katz, MD;  Location: [facility];  Service: Obstetrics;  Laterality: N/A;  . GYNECOLOGIC CRYOSURGERY    . MELANOMA EXCISION  2005   L arm  . WISDOM TOOTH EXTRACTION  1994    MEDICATIONS: Current Outpatient Medications on File Prior to Visit  Medication Sig Dispense Refill  . buPROPion (WELLBUTRIN XL) 150 MG 24 hr tablet Take 150 mg by mouth every morning.  5  . levothyroxine (SYNTHROID, LEVOTHROID) 50 MCG tablet Take 50 mcg by  mouth daily.  0  . Multiple Vitamin (MULTIVITAMIN) tablet Take 1 tablet by mouth daily.     No current facility-administered medications on file prior to visit.     ALLERGIES: Allergies  Allergen Reactions  . Erythromycin Nausea And Vomiting  . Augmentin [Amoxicillin-Pot Clavulanate] Anxiety    "panic attack"    FAMILY HISTORY: Family History  Problem Relation Age of Onset  . Depression Mother   . Hypertension Father   . Diabetes Father   . COPD Maternal Grandmother   . Cancer Maternal Grandmother   . COPD Paternal Grandmother   . Heart attack Paternal Grandfather   . Other Neg Hx     SOCIAL HISTORY: Social History   Socioeconomic History  . Marital status: Married    Spouse name: Not on file  . Number of children: 3  . Years of education: Not on file  . Highest education level: Not on file  Social Needs  . Financial resource strain: Not on file  . Food insecurity - worry: Not on file  . Food insecurity - inability: Not on file  . Transportation needs - medical: Not on file  . Transportation needs - non-medical: Not on file  Occupational History    Comment: Software engineer - [employer]  Tobacco Use  . Smoking status: Never Smoker  . Smokeless tobacco: Never Used  Substance and Sexual Activity  . Alcohol use: No  . Drug  use: No  . Sexual activity: Not on file  Other Topics Concern  . Not on file  Social History Narrative   Epworth Sleepiness Scale Score: 6 (07/29/15)      --I feel stressed and lack motivation   --I am overweight or am gaining weight         Pt has been married for 20 years, has three children, and lives at home with husband and children. She cleans house, shops, does yard work, and drives. 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 She completed Sophomore year in college.    REVIEW OF SYSTEMS: Constitutional: No fevers, chills, or sweats, no generalized fatigue, change in appetite Eyes: No visual changes, double  vision, eye pain Ear, nose and throat: No hearing loss, ear pain, nasal congestion, sore throat Cardiovascular: No chest pain, palpitations Respiratory:  No shortness of breath at rest or with exertion, wheezes GastrointestinaI: No nausea, vomiting, diarrhea, abdominal pain, fecal incontinence Genitourinary:  No dysuria, urinary retention or frequency Musculoskeletal:  No neck pain, back pain Integumentary: No rash, pruritus, skin lesions Neurological: as above Psychiatric: No depression, insomnia, anxiety Endocrine: No palpitations, fatigue, diaphoresis, mood swings, change in appetite, change in weight, increased thirst Hematologic/Lymphatic:  No purpura, petechiae. Allergic/Immunologic: no itchy/runny eyes, nasal congestion, recent allergic reactions, rashes  PHYSICAL EXAM: Vitals:   04/14/17 1302  BP: 120/80  Pulse: 92  SpO2: 98%   General: No acute distress.  Patient appears well-groomed.  Head:  Normocephalic/atraumatic Eyes:  fundi examined but not visualized Neck: supple, no paraspinal tenderness, full range of motion Back: No paraspinal tenderness Heart: regular rate and rhythm Lungs: Clear to auscultation bilaterally. Vascular: No carotid bruits. Neurological Exam: Mental status: alert and oriented to person, place, and time, recent and remote memory intact, fund of knowledge intact, attention and concentration intact, speech fluent and not dysarthric, language intact. Cranial nerves: CN I: not tested CN II: pupils equal, round and reactive to light, visual fields intact CN III, IV, VI:  full range of motion, no nystagmus, no ptosis CN V: facial sensation intact CN VII: upper and lower face symmetric CN VIII: hearing intact CN IX, X: gag intact, uvula midline CN XI: sternocleidomastoid and trapezius muscles intact CN XII: tongue midline Bulk & Tone: normal, no fasciculations. Motor:  5/5 throughout  Sensation: temperature and vibration sensation intact. Deep  Tendon Reflexes:  2+ throughout, toes downgoing.  Finger to nose testing:  Without dysmetria.  Heel to shin:  Without dysmetria.  Gait:  Normal station and stride.  Able to turn and tandem walk. Romberg negative.  IMPRESSION: Left-sided Bell's Palsy  PLAN: No further recommendations Follow up as needed.  Thank you for allowing me to take part in the care of this patient.  45 minutes spent face to face with patient, over 50% spent discussing diagnosis.  Metta Clines, DO  CC:  Harlan Stains, MD  Damaris Hippo, MD

## 2017-04-14 NOTE — Patient Instructions (Signed)
Bell Palsy, Adult Bell palsy is a short-term inability to move muscles in part of the face. The inability to move (paralysis) results from inflammation or compression of the facial nerve, which travels along the skull and under the ear to the side of the face (7th cranial nerve). This nerve is responsible for facial movements that include blinking, closing the eyes, smiling, and frowning. What are the causes? The exact cause of this condition is not known. It may be caused by an infection from a virus, such as the chickenpox (herpes zoster), Epstein-Barr, or mumps virus. What increases the risk? You are more likely to develop this condition if:  You are pregnant.  You have diabetes.  You have had a recent infection in your nose, throat, or airways (upper respiratory infection).  You have a weakened body defense system (immune system).  You have had a facial injury, such as a fracture.  You have a family history of Bell palsy.  What are the signs or symptoms? Symptoms of this condition include:  Weakness on one side of the face.  Drooping eyelid and corner of the mouth.  Excessive tearing in one eye.  Difficulty closing the eyelid.  Dry eye.  Drooling.  Dry mouth.  Changes in taste.  Change in facial appearance.  Pain behind one ear.  Ringing in one or both ears.  Sensitivity to sound in one ear.  Facial twitching.  Headache.  Impaired speech.  Dizziness.  Difficulty eating or drinking.  Most of the time, only one side of the face is affected. Rarely, Bell palsy affects the whole face. How is this diagnosed? This condition is diagnosed based on:  Your symptoms.  Your medical history.  A physical exam.  You may also have to see health care providers who specialize in disorders of the nerves (neurologist) or diseases and conditions of the eye (ophthalmologist). You may have tests, such as:  A test to check for nerve damage (electromyogram).  Imaging  studies, such as CT or MRI scans.  Blood tests.  How is this treated? This condition affects every person differently. Sometimes symptoms go away without treatment within a couple weeks. If treatment is needed, it varies from person to person. The goal of treatment is to reduce inflammation and protect the eye from damage. Treatment for Bell palsy may include:  Medicines, such as: ? Steroids to reduce swelling and inflammation. ? Antiviral drugs. ? Pain relievers, including aspirin, acetaminophen, or ibuprofen.  Eye drops or ointment to keep your eye moist.  Eye protection, if you cannot close your eye.  Exercises or massage to regain muscle strength and function (physical therapy).  Follow these instructions at home:  Take over-the-counter and prescription medicines only as told by your health care provider.  If your eye is affected: ? Keep your eye moist with eye drops or ointment as told by your health care provider. ? Follow instructions for eye care and protection as told by your health care provider.  Do any physical therapy exercises as told by your health care provider.  Keep all follow-up visits as told by your health care provider. This is important. Contact a health care provider if:  You have a fever.  Your symptoms do not get better within 2-3 weeks, or your symptoms get worse.  Your eye is red, irritated, or painful.  You have new symptoms. Get help right away if:  You have weakness or numbness in a part of your body other than your face.    You have trouble swallowing.  You develop neck pain or stiffness.  You develop dizziness or shortness of breath. Summary  Bell palsy is a short-term inability to move muscles in part of the face. The inability to move (paralysis) results from inflammation or compression of the facial nerve.  This condition affects every person differently. Sometimes symptoms go away without treatment within a couple weeks.  If  treatment is needed, it varies from person to person. The goal of treatment is to reduce inflammation and protect the eye from damage.  Contact your health care provider if your symptoms do not get better within 2-3 weeks, or your symptoms get worse. This information is not intended to replace advice given to you by your health care provider. Make sure you discuss any questions you have with your health care provider. Document Released: 04/26/2005 Document Revised: 06/29/2016 Document Reviewed: 06/29/2016 Elsevier Interactive Patient Education  2018 [employer] Inc[employer]  

## 2017-04-27 DIAGNOSIS — K080 Exfoliation of teeth due to systemic causes: Secondary | ICD-10-CM | POA: Diagnosis not present

## 2017-05-05 DIAGNOSIS — E288 Other ovarian dysfunction: Secondary | ICD-10-CM | POA: Diagnosis not present

## 2017-06-13 DIAGNOSIS — Z3200 Encounter for pregnancy test, result unknown: Secondary | ICD-10-CM | POA: Diagnosis not present

## 2017-06-15 DIAGNOSIS — N979 Female infertility, unspecified: Secondary | ICD-10-CM | POA: Diagnosis not present

## 2017-06-15 DIAGNOSIS — E288 Other ovarian dysfunction: Secondary | ICD-10-CM | POA: Diagnosis not present

## 2017-06-15 DIAGNOSIS — Z3200 Encounter for pregnancy test, result unknown: Secondary | ICD-10-CM | POA: Diagnosis not present

## 2017-06-15 DIAGNOSIS — Z3201 Encounter for pregnancy test, result positive: Secondary | ICD-10-CM | POA: Diagnosis not present

## 2017-06-20 DIAGNOSIS — F419 Anxiety disorder, unspecified: Secondary | ICD-10-CM | POA: Diagnosis not present

## 2017-06-20 DIAGNOSIS — I10 Essential (primary) hypertension: Secondary | ICD-10-CM | POA: Diagnosis not present

## 2017-06-23 DIAGNOSIS — I10 Essential (primary) hypertension: Secondary | ICD-10-CM | POA: Diagnosis not present

## 2017-06-23 DIAGNOSIS — F419 Anxiety disorder, unspecified: Secondary | ICD-10-CM | POA: Diagnosis not present

## 2017-06-23 DIAGNOSIS — Z3A01 Less than 8 weeks gestation of pregnancy: Secondary | ICD-10-CM | POA: Diagnosis not present

## 2017-06-26 ENCOUNTER — Encounter (HOSPITAL_BASED_OUTPATIENT_CLINIC_OR_DEPARTMENT_OTHER): Payer: Self-pay | Admitting: *Deleted

## 2017-06-26 ENCOUNTER — Emergency Department (HOSPITAL_BASED_OUTPATIENT_CLINIC_OR_DEPARTMENT_OTHER)
Admission: EM | Admit: 2017-06-26 | Discharge: 2017-06-26 | Disposition: A | Discharge status: Home / Self Care | Payer: Federal, State, Local not specified - PPO | Attending: Emergency Medicine | Admitting: Emergency Medicine

## 2017-06-26 ENCOUNTER — Other Ambulatory Visit: Payer: Self-pay

## 2017-06-26 DIAGNOSIS — R55 Syncope and collapse: Secondary | ICD-10-CM | POA: Diagnosis not present

## 2017-06-26 DIAGNOSIS — R002 Palpitations: Secondary | ICD-10-CM | POA: Diagnosis not present

## 2017-06-26 DIAGNOSIS — Z79899 Other long term (current) drug therapy: Secondary | ICD-10-CM | POA: Diagnosis not present

## 2017-06-26 DIAGNOSIS — O26891 Other specified pregnancy related conditions, first trimester: Secondary | ICD-10-CM | POA: Diagnosis not present

## 2017-06-26 DIAGNOSIS — R42 Dizziness and giddiness: Secondary | ICD-10-CM | POA: Diagnosis not present

## 2017-06-26 LAB — CBC WITH DIFFERENTIAL/PLATELET
Basophils Absolute: 0 10*3/uL (ref 0.0–0.1)
Basophils Relative: 0 %
EOS ABS: 0.1 10*3/uL (ref 0.0–0.7)
EOS PCT: 1 %
HCT: 37.4 % (ref 36.0–46.0)
HEMOGLOBIN: 13 g/dL (ref 12.0–15.0)
LYMPHS ABS: 1.5 10*3/uL (ref 0.7–4.0)
Lymphocytes Relative: 13 %
MCH: 30.8 pg (ref 26.0–34.0)
MCHC: 34.8 g/dL (ref 30.0–36.0)
MCV: 88.6 fL (ref 78.0–100.0)
MONO ABS: 0.5 10*3/uL (ref 0.1–1.0)
MONOS PCT: 4 %
Neutro Abs: 9.2 10*3/uL — ABNORMAL HIGH (ref 1.7–7.7)
Neutrophils Relative %: 82 %
PLATELETS: 243 10*3/uL (ref 150–400)
RBC: 4.22 MIL/uL (ref 3.87–5.11)
RDW: 11.9 % (ref 11.5–15.5)
WBC: 11.4 10*3/uL — ABNORMAL HIGH (ref 4.0–10.5)

## 2017-06-26 LAB — URINALYSIS, ROUTINE W REFLEX MICROSCOPIC
Bilirubin Urine: NEGATIVE
GLUCOSE, UA: NEGATIVE mg/dL
Ketones, ur: 40 mg/dL — AB
Nitrite: NEGATIVE
PROTEIN: NEGATIVE mg/dL
Specific Gravity, Urine: 1.01 (ref 1.005–1.030)
pH: 6 (ref 5.0–8.0)

## 2017-06-26 LAB — URINALYSIS, MICROSCOPIC (REFLEX)

## 2017-06-26 LAB — BASIC METABOLIC PANEL
Anion gap: 9 (ref 5–15)
BUN: 8 mg/dL (ref 6–20)
CHLORIDE: 103 mmol/L (ref 101–111)
CO2: 21 mmol/L — AB (ref 22–32)
CREATININE: 0.75 mg/dL (ref 0.44–1.00)
Calcium: 9 mg/dL (ref 8.9–10.3)
GFR calc Af Amer: >60 mL/min (ref >60)
GFR calc non Af Amer: >60 mL/min (ref >60)
GLUCOSE: 105 mg/dL — AB (ref 65–99)
Potassium: 3.8 mmol/L (ref 3.5–5.1)
Sodium: 133 mmol/L — ABNORMAL LOW (ref 135–145)

## 2017-06-26 LAB — CBG MONITORING, ED: GLUCOSE-CAPILLARY: 100 mg/dL — AB (ref 65–99)

## 2017-06-26 LAB — PREGNANCY, URINE: PREG TEST UR: POSITIVE — AB

## 2017-06-26 MED ORDER — SODIUM CHLORIDE 0.9 % IV BOLUS (SEPSIS)
1000.0000 mL | Freq: Once | INTRAVENOUS | Status: AC
  Administered 2017-06-26: 1000 mL via INTRAVENOUS

## 2017-06-26 MED ORDER — LORAZEPAM 2 MG/ML IJ SOLN
0.5000 mg | Freq: Once | INTRAMUSCULAR | Status: AC
  Administered 2017-06-26: 0.5 mg via INTRAVENOUS
  Filled 2017-06-26: qty 1

## 2017-06-26 NOTE — Discharge Instructions (Signed)
As we discussed, discussed the hormone usage with your OB/GYN tomorrow.  You  also need to have close follow-up with your blood pressure this week.  Please hold your labetalol for now.

## 2017-06-26 NOTE — ED Notes (Signed)
ED Provider at bedside. 

## 2017-06-26 NOTE — ED Notes (Signed)
Pt provided wheelchair and asked to use it for any movement around department

## 2017-06-26 NOTE — ED Triage Notes (Addendum)
Pt reports she is on fertility medications. States tonight had a "hot flash" and passed out. This happened at 1130 pm. Pt states episode lasted about 10 mins. States she has had other episodes since then where she thought she might pass out. States "they just come on". States sx are alleviated by cold air.

## 2017-06-26 NOTE — ED Provider Notes (Signed)
[name] Provider Note   CSN: [id number] Arrival date & time: 06/26/17  0051     History   Chief Complaint Chief Complaint  Patient presents with  . Loss of Consciousness    pregnant    HPI [name] is a 47 y.o. female.  The history is provided by the patient and the spouse.  Loss of Consciousness   This is a new problem. The current episode started 1 to 2 hours ago. The problem occurs rarely. The problem has been gradually improving. She lost consciousness for a period of less than one minute. The problem is associated with normal activity. Associated symptoms include diaphoresis, dizziness, nausea and palpitations. Pertinent negatives include abdominal pain, back pain, chest pain, focal weakness, seizures and vomiting. Treatments tried: rest. The treatment provided mild relief. Her past medical history does not include CAD or seizures. Past medical history comments: no h/o PE.  Patient presents with syncopal episode.  She has been undergoing fertility treatments with progesterone injection, estradiol tablets, and estrogen patches.  She is approximately [redacted] weeks pregnant per patient  this past week it was noted that she had episodes of elevated blood pressure.  She has been started on nifedipine and labetalol.  She was also started on Zoloft for anxiety.  She reports over the past week she been having episodes of "hot flashes" feeling dizzy and flushed.  Tonight while at home soon after taking labetalol she had an episode of syncope.  Her husband heard her fall and he found her on the floor.  LOC less than 1 minute.  No seizures.  No post fall confusion.  No evidence of trauma or injury reported  She denies any chest pain/shortness of breath.  No abdominal pain/back pain/vaginal bleeding.  Past Medical History:  Diagnosis Date  . AMA (advanced maternal age) multigravida 10+   . Anxiety   . Cancer ([place])    skin  . Gestational diabetes    previous pregnancy  . H/O varicella   . Migraine   . PONV (postoperative nausea and vomiting)     Patient Active Problem List   Diagnosis Date Noted  . Anxiety 07/29/2015  . Palpitations 07/29/2015  . Hypothyroidism 07/29/2015    Past Surgical History:  Procedure Laterality Date  . CESAREAN SECTION  03/21/2012   Procedure: CESAREAN SECTION;  Surgeon: Allena Katz, MD;  Location: [facility];  Service: Obstetrics;  Laterality: N/A;  . GYNECOLOGIC CRYOSURGERY    . MELANOMA EXCISION  2005   L arm  . WISDOM TOOTH EXTRACTION  1994    OB History    Gravida Para Term Preterm AB Living   4 3 3     3    SAB TAB Ectopic Multiple Live Births           3       Home Medications    Prior to Admission medications   Medication Sig Start Date End Date Taking? Authorizing Provider  buPROPion (WELLBUTRIN XL) 150 MG 24 hr tablet Take 150 mg by mouth every morning. 07/25/15  Yes [provider]  estradiol (ESTRACE) 2 MG tablet Take 2 mg by mouth 2 (two) times daily.   Yes [provider]  labetalol (NORMODYNE) 100 MG tablet Take 100 mg by mouth 2 (two) times daily.   Yes [provider]  levothyroxine (SYNTHROID, LEVOTHROID) 50 MCG tablet Take 50 mcg by mouth daily. 07/25/15  Yes [provider]  Prenatal Vit-Fe Fumarate-FA (  PRENATAL MULTIVITAMIN) TABS tablet Take 1 tablet by mouth daily at 12 noon.   Yes [provider]  PROGESTERONE IM Inject 1 mL into the muscle daily.   Yes [provider]  sertraline (ZOLOFT) 50 MG tablet Take 50 mg by mouth daily.   Yes [provider]  Multiple Vitamin (MULTIVITAMIN) tablet Take 1 tablet by mouth daily.    [provider]    Family History Family History  Problem Relation Age of Onset  . Depression Mother   . Hypertension Father   . Diabetes Father   . COPD Maternal Grandmother   . Cancer Maternal Grandmother   . COPD Paternal Grandmother   . Heart attack Paternal  Grandfather   . Other Neg Hx     Social History Social History   Tobacco Use  . Smoking status: Never Smoker  . Smokeless tobacco: Never Used  Substance Use Topics  . Alcohol use: No  . Drug use: No     Allergies   Erythromycin and Augmentin [amoxicillin-pot clavulanate]   Review of Systems Review of Systems  Constitutional: Positive for diaphoresis.  Respiratory: Negative for shortness of breath.   Cardiovascular: Positive for palpitations and syncope. Negative for chest pain.  Gastrointestinal: Positive for nausea. Negative for abdominal pain and vomiting.  Genitourinary: Negative for vaginal bleeding.  Musculoskeletal: Negative for back pain.  Neurological: Positive for dizziness and syncope. Negative for focal weakness and seizures.  Psychiatric/Behavioral: The patient is nervous/anxious.   All other systems reviewed and are negative.    Physical Exam Updated Vital Signs BP 130/87   Pulse 85   Temp 98.6 F (37 C) (Oral)   Resp 20   Ht 1.626 m (5\' 4" )   Wt 93 kg (205 lb)   LMP 05/14/2017   SpO2 100%   BMI 35.19 kg/m   Physical Exam CONSTITUTIONAL: Well developed/well nourished HEAD: Normocephalic/atraumatic EYES: EOMI/PERRL ENMT: Mucous membranes moist NECK: supple no meningeal signs SPINE/BACK:entire spine nontender, no bruising/crepitance/stepoffs noted to spine CV: S1/S2 noted, no murmurs/rubs/gallops noted LUNGS: Lungs are clear to auscultation bilaterally, no apparent distress ABDOMEN: soft, nontender, no rebound or guarding, bowel sounds noted throughout abdomen GU:no cva tenderness NEURO: Pt is awake/alert/appropriate, moves all extremitiesx4.  No facial droop.  No arm or leg drift EXTREMITIES: pulses normal/equal, full ROM, no lower extremity edema or tenderness All other extremities/joints palpated/ranged and nontender SKIN: warm, color normal PSYCH: no abnormalities of mood noted, alert and oriented to situation   ED Treatments / Results    Labs (all labs ordered are listed, but only abnormal results are displayed) Labs Reviewed  BASIC METABOLIC PANEL - Abnormal; Notable for the following components:      Result Value   Sodium 133 (*)    CO2 21 (*)    Glucose, Bld 105 (*)    All other components within normal limits  CBC WITH DIFFERENTIAL/PLATELET - Abnormal; Notable for the following components:   WBC 11.4 (*)    Neutro Abs 9.2 (*)    All other components within normal limits  PREGNANCY, URINE - Abnormal; Notable for the following components:   Preg Test, Ur POSITIVE (*)    All other components within normal limits  URINALYSIS, ROUTINE W REFLEX MICROSCOPIC - Abnormal; Notable for the following components:   APPearance HAZY (*)    Hgb urine dipstick TRACE (*)    Ketones, ur 40 (*)    Leukocytes, UA TRACE (*)    All other components within normal limits  URINALYSIS, MICROSCOPIC (REFLEX) - Abnormal; Notable for the following components:   Bacteria, UA FEW (*)    Squamous Epithelial / LPF 0-5 (*)    All other components within normal limits  CBG MONITORING, ED - Abnormal; Notable for the following components:   Glucose-Capillary 100 (*)    All other components within normal limits    EKG  EKG Interpretation  Date/Time:  Sunday June 26 2017 01:10:57 EST Ventricular Rate:  83 PR Interval:  136 QRS Duration: 82 QT Interval:  358 QTC Calculation: 420 R Axis:   -8 Text Interpretation:  Normal sinus rhythm Normal ECG No significant change since last tracing Confirmed by Ripley Fraise [phone]) on 06/26/2017 1:48:42 AM       Radiology No results found.  Procedures Procedures (including critical care time)  Medications Ordered in ED Medications  sodium chloride 0.9 % bolus 1,000 mL (1,000 mLs Intravenous New Bag/Given 06/26/17 0324)  LORazepam (ATIVAN) injection 0.5 mg (0.5 mg Intravenous Given 06/26/17 0324)     Initial Impression / Assessment and Plan / ED Course  I have reviewed the triage vital  signs and the nursing notes.  Pertinent labs results that were available during my care of the patient were reviewed by me and considered in my medical decision making (see chart for details).     4:42 AM Patient with complicated history.  It appears she is been having near syncopal episodes this past week, and had an episode of syncope tonight.  She thinks it is related to labetalol usage.  She has had 2 separate BP meds started the past week.  She is also on Zoloft for anxiety. She adamantly denies abdominal pain or vaginal bleeding. I do agree this is likely due to her medications.  Her EKG is unchanged. She is scheduled for ultrasound tomorrow.  I do feel that we need to stop labetalol My suspicion for ACS/PE is low.  My suspicion for ectopic pregnancy is low as she has no abdominal pain or vaginal bleeding 5:09 AM Patient improved.  She has had no further episodes.  No chest pain reported.  No abdominal pain reported.  She would like to be discharged.  She will stop labetalol for now.  She will see her OB/GYN tomorrow.  She would need to discuss hormone usage at that time.  She would need close follow-up for blood pressure management. Final Clinical Impressions(s) / ED Diagnoses   Final diagnoses:  Vasovagal syncope    ED Discharge Orders    None       Ripley Fraise, MD 06/26/17 [phone]

## 2017-06-27 DIAGNOSIS — F419 Anxiety disorder, unspecified: Secondary | ICD-10-CM | POA: Diagnosis not present

## 2017-06-27 DIAGNOSIS — R030 Elevated blood-pressure reading, without diagnosis of hypertension: Secondary | ICD-10-CM | POA: Diagnosis not present

## 2017-06-27 DIAGNOSIS — O0900 Supervision of pregnancy with history of infertility, unspecified trimester: Secondary | ICD-10-CM | POA: Diagnosis not present

## 2017-07-06 DIAGNOSIS — O4691 Antepartum hemorrhage, unspecified, first trimester: Secondary | ICD-10-CM | POA: Diagnosis not present

## 2017-07-13 DIAGNOSIS — O0900 Supervision of pregnancy with history of infertility, unspecified trimester: Secondary | ICD-10-CM | POA: Diagnosis not present

## 2017-07-16 ENCOUNTER — Other Ambulatory Visit: Payer: Self-pay

## 2017-07-16 ENCOUNTER — Emergency Department (HOSPITAL_BASED_OUTPATIENT_CLINIC_OR_DEPARTMENT_OTHER)
Admission: EM | Admit: 2017-07-16 | Discharge: 2017-07-17 | Disposition: A | Discharge status: Home / Self Care | Payer: Federal, State, Local not specified - PPO | Attending: Emergency Medicine | Admitting: Emergency Medicine

## 2017-07-16 ENCOUNTER — Encounter (HOSPITAL_BASED_OUTPATIENT_CLINIC_OR_DEPARTMENT_OTHER): Payer: Self-pay | Admitting: Emergency Medicine

## 2017-07-16 DIAGNOSIS — O2311 Infections of bladder in pregnancy, first trimester: Secondary | ICD-10-CM | POA: Diagnosis not present

## 2017-07-16 DIAGNOSIS — O2341 Unspecified infection of urinary tract in pregnancy, first trimester: Secondary | ICD-10-CM | POA: Diagnosis not present

## 2017-07-16 DIAGNOSIS — Z79899 Other long term (current) drug therapy: Secondary | ICD-10-CM | POA: Diagnosis not present

## 2017-07-16 DIAGNOSIS — O209 Hemorrhage in early pregnancy, unspecified: Secondary | ICD-10-CM | POA: Diagnosis not present

## 2017-07-16 DIAGNOSIS — Z3A09 9 weeks gestation of pregnancy: Secondary | ICD-10-CM | POA: Insufficient documentation

## 2017-07-16 DIAGNOSIS — N939 Abnormal uterine and vaginal bleeding, unspecified: Secondary | ICD-10-CM

## 2017-07-16 DIAGNOSIS — R8271 Bacteriuria: Secondary | ICD-10-CM

## 2017-07-16 DIAGNOSIS — R102 Pelvic and perineal pain: Secondary | ICD-10-CM | POA: Diagnosis not present

## 2017-07-16 LAB — URINALYSIS, ROUTINE W REFLEX MICROSCOPIC
Bilirubin Urine: NEGATIVE
GLUCOSE, UA: NEGATIVE mg/dL
Ketones, ur: NEGATIVE mg/dL
LEUKOCYTES UA: NEGATIVE
Nitrite: NEGATIVE
PH: 6.5 (ref 5.0–8.0)
Protein, ur: NEGATIVE mg/dL
SPECIFIC GRAVITY, URINE: 1.01 (ref 1.005–1.030)

## 2017-07-16 LAB — CBC WITH DIFFERENTIAL/PLATELET
BASOS PCT: 0 %
Basophils Absolute: 0 10*3/uL (ref 0.0–0.1)
EOS ABS: 0.2 10*3/uL (ref 0.0–0.7)
EOS PCT: 2 %
HCT: 40.2 % (ref 36.0–46.0)
Hemoglobin: 13.6 g/dL (ref 12.0–15.0)
Lymphocytes Relative: 16 %
Lymphs Abs: 1.7 10*3/uL (ref 0.7–4.0)
MCH: 30.4 pg (ref 26.0–34.0)
MCHC: 33.8 g/dL (ref 30.0–36.0)
MCV: 89.7 fL (ref 78.0–100.0)
Monocytes Absolute: 0.7 10*3/uL (ref 0.1–1.0)
Monocytes Relative: 7 %
NEUTROS PCT: 75 %
Neutro Abs: 7.8 10*3/uL — ABNORMAL HIGH (ref 1.7–7.7)
PLATELETS: 245 10*3/uL (ref 150–400)
RBC: 4.48 MIL/uL (ref 3.87–5.11)
RDW: 11.8 % (ref 11.5–15.5)
WBC: 10.4 10*3/uL (ref 4.0–10.5)

## 2017-07-16 LAB — BASIC METABOLIC PANEL
Anion gap: 8 (ref 5–15)
BUN: 9 mg/dL (ref 6–20)
CO2: 26 mmol/L (ref 22–32)
CREATININE: 0.62 mg/dL (ref 0.44–1.00)
Calcium: 9.4 mg/dL (ref 8.9–10.3)
Chloride: 103 mmol/L (ref 101–111)
Glucose, Bld: 97 mg/dL (ref 65–99)
POTASSIUM: 4.2 mmol/L (ref 3.5–5.1)
SODIUM: 137 mmol/L (ref 135–145)

## 2017-07-16 LAB — HCG, QUANTITATIVE, PREGNANCY: HCG, BETA CHAIN, QUANT, S: 144050 m[IU]/mL — AB (ref <5)

## 2017-07-16 LAB — WET PREP, GENITAL
Clue Cells Wet Prep HPF POC: NONE SEEN
Sperm: NONE SEEN
Trich, Wet Prep: NONE SEEN
YEAST WET PREP: NONE SEEN

## 2017-07-16 LAB — URINALYSIS, MICROSCOPIC (REFLEX)

## 2017-07-16 LAB — PREGNANCY, URINE: Preg Test, Ur: POSITIVE — AB

## 2017-07-16 MED ORDER — CEPHALEXIN 500 MG PO CAPS: 500.0000 mg | ORAL_CAPSULE | Freq: Three times a day (TID) | ORAL | 0 refills | Status: AC

## 2017-07-16 NOTE — ED Triage Notes (Signed)
Pt c/o vag bleeding and lower abd cramping since 1p; sts she is [redacted] wks pregnant

## 2017-07-16 NOTE — ED Provider Notes (Signed)
[facility] DEPARTMENT Provider Note   CSN: [id number] Arrival date & time: 07/16/17  1958     History   Chief Complaint Chief Complaint  Patient presents with  . Vaginal Bleeding    HPI [name] is a 47 y.o. female who is G4 P3 who is currently [redacted] weeks pregnant from previous ultrasound who presents for evaluation of vaginal bleeding.  Patient reports that approximate 1 PM this afternoon, she started having some lower abdominal cramping.  Patient reports that when she went home and use the bathroom, when she wiped she had some blood on the toilet paper.  She reports that every time she has gone to the bathroom, she has had blood on the toilet paper.  She states that she is not soaking a pad in between going to the bathroom.  Patient also reports that she was having to strain to have a bowel movement does not know if she was having blood from straining.  She denies any history of hemorrhoids.  She states she has not had any clots passed.  Patient is currently being seen by [facility] for trial fertility for IVF treatments.  She was released from her toileting specialist last week and was instructed to follow-up with OB/GYN next week.  She has not seen them yet.  Patient denies any chest pain, difficulty breathing, dysuria.  Patient denies any preceding trauma, injury, fall.  Patient does report that she had sexual intercourse this morning prior to onset of symptoms.  The history is provided by the patient.    Past Medical History:  Diagnosis Date  . AMA (advanced maternal age) multigravida 46+   . Anxiety   . Cancer ([place])    skin  . Gestational diabetes    previous pregnancy  . H/O varicella   . Migraine   . PONV (postoperative nausea and vomiting)     Patient Active Problem List   Diagnosis Date Noted  . Anxiety 07/29/2015  . Palpitations 07/29/2015  . Hypothyroidism 07/29/2015    Past Surgical History:  Procedure Laterality Date  .  CESAREAN SECTION  03/21/2012   Procedure: CESAREAN SECTION;  Surgeon: Allena Katz, MD;  Location: [facility];  Service: Obstetrics;  Laterality: N/A;  . GYNECOLOGIC CRYOSURGERY    . MELANOMA EXCISION  2005   L arm  . WISDOM TOOTH EXTRACTION  1994    OB History    Gravida Para Term Preterm AB Living   4 3 3     3    SAB TAB Ectopic Multiple Live Births           3       Home Medications    Prior to Admission medications   Medication Sig Start Date End Date Taking? Authorizing Provider  buPROPion (WELLBUTRIN XL) 150 MG 24 hr tablet Take 150 mg by mouth every morning. 07/25/15   [provider]  cephALEXin (KEFLEX) 500 MG capsule Take 1 capsule (500 mg total) by mouth 3 (three) times daily for 7 days. 07/16/17 07/23/17  Volanda Napoleon, PA-C  estradiol (ESTRACE) 2 MG tablet Take 2 mg by mouth 2 (two) times daily.    [provider]  labetalol (NORMODYNE) 100 MG tablet Take 100 mg by mouth 2 (two) times daily.    [provider]  levothyroxine (SYNTHROID, LEVOTHROID) 50 MCG tablet Take 50 mcg by mouth daily. 07/25/15   [provider]  Multiple Vitamin (MULTIVITAMIN) tablet Take 1 tablet by mouth  daily.    [provider]  Prenatal Vit-Fe Fumarate-FA (PRENATAL MULTIVITAMIN) TABS tablet Take 1 tablet by mouth daily at 12 noon.    [provider]  PROGESTERONE IM Inject 1 mL into the muscle daily.    [provider]  sertraline (ZOLOFT) 50 MG tablet Take 50 mg by mouth daily.    [provider]    Family History Family History  Problem Relation Age of Onset  . Depression Mother   . Hypertension Father   . Diabetes Father   . COPD Maternal Grandmother   . Cancer Maternal Grandmother   . COPD Paternal Grandmother   . Heart attack Paternal Grandfather   . Other Neg Hx     Social History Social History   Tobacco Use  . Smoking status: Never Smoker  . Smokeless tobacco: Never Used  Substance Use Topics  .  Alcohol use: No  . Drug use: No     Allergies   Erythromycin and Augmentin [amoxicillin-pot clavulanate]   Review of Systems Review of Systems  Constitutional: Negative for chills and fever.  HENT: Negative for congestion.   Eyes: Negative for visual disturbance.  Respiratory: Negative for cough and shortness of breath.   Cardiovascular: Negative for chest pain.  Gastrointestinal: Positive for abdominal pain. Negative for diarrhea, nausea and vomiting.  Genitourinary: Positive for vaginal bleeding. Negative for dysuria and hematuria.  Musculoskeletal: Negative for back pain and neck pain.  Skin: Negative for rash.  Neurological: Negative for dizziness, weakness, numbness and headaches.  Psychiatric/Behavioral: Negative for confusion.     Physical Exam Updated Vital Signs BP 132/84 (BP Location: Left Arm)   Pulse 96   Temp 98.1 F (36.7 C) (Oral)   Resp 18   LMP 05/14/2017   SpO2 100%   Physical Exam  Constitutional: She is oriented to person, place, and time. She appears well-developed and well-nourished.  HENT:  Head: Normocephalic and atraumatic.  Mouth/Throat: Oropharynx is clear and moist and mucous membranes are normal.  Eyes: Conjunctivae, EOM and lids are normal. Pupils are equal, round, and reactive to light.  Neck: Full passive range of motion without pain.  Cardiovascular: Normal rate, regular rhythm, normal heart sounds and normal pulses. Exam reveals no gallop and no friction rub.  No murmur heard. Pulmonary/Chest: Effort normal and breath sounds normal.  Abdominal: Soft. Normal appearance. There is tenderness in the suprapubic area. There is no rigidity and no guarding.  Genitourinary: Vagina normal and uterus normal. Rectal exam shows no external hemorrhoid. Cervix exhibits no motion tenderness. Right adnexum displays no tenderness. Left adnexum displays no mass and no tenderness.  Genitourinary Comments: The exam was performed with a chaperone present.  Normal external female genitalia. No lesions, rash, or sores.  Small amount of brown discharge noted at the cervix.  Difficulty visualizing first full cervix but office appears to be closed.  No vaginal bleeding noted.  No CMT, adnexal tenderness, mass bilaterally.  Uterus is normal.  Rectal exam showed no external hemorrhoid, anal fissure.  Musculoskeletal: Normal range of motion.  Neurological: She is alert and oriented to person, place, and time.  Skin: Skin is warm and dry. Capillary refill takes less than 2 seconds.  Psychiatric: She has a normal mood and affect. Her speech is normal.  Nursing note and vitals reviewed.    ED Treatments / Results  Labs (all labs ordered are listed, but only abnormal results are displayed) Labs Reviewed  WET PREP, GENITAL - Abnormal; Notable for the  following components:      Result Value   WBC, Wet Prep HPF POC MODERATE (*)    All other components within normal limits  PREGNANCY, URINE - Abnormal; Notable for the following components:   Preg Test, Ur POSITIVE (*)    All other components within normal limits  URINALYSIS, ROUTINE W REFLEX MICROSCOPIC - Abnormal; Notable for the following components:   Color, Urine STRAW (*)    APPearance HAZY (*)    Hgb urine dipstick LARGE (*)    All other components within normal limits  URINALYSIS, MICROSCOPIC (REFLEX) - Abnormal; Notable for the following components:   Bacteria, UA MANY (*)    Squamous Epithelial / LPF 0-5 (*)    All other components within normal limits  CBC WITH DIFFERENTIAL/PLATELET - Abnormal; Notable for the following components:   Neutro Abs 7.8 (*)    All other components within normal limits  HCG, QUANTITATIVE, PREGNANCY - Abnormal; Notable for the following components:   hCG, Beta Chain, Quant, S 144,050 (*)    All other components within normal limits  BASIC METABOLIC PANEL  GC/CHLAMYDIA PROBE AMP ([place]) NOT AT [facility]    EKG  EKG Interpretation None        Radiology No results found.  Procedures Procedures (including critical care time)  Medications Ordered in ED Medications - No data to display   Initial Impression / Assessment and Plan / ED Course  I have reviewed the triage vital signs and the nursing notes.  Pertinent labs & imaging results that were available during my care of the patient were reviewed by me and considered in my medical decision making (see chart for details).     47 year old female who is currently G4, P3 who is currently [redacted] weeks pregnant via IVF who presents for evaluation of vaginal bleeding that began today.  Patient reports that she has had episodes of vaginal bleeding when wiping.  No clots passed.  She is not saturating any pads.  No fevers, vomiting, clots passing.  Patient does report some lower abdominal cramping.  Patient reports that she had an ultrasound confirmed the pregnancy last week that showed it was intrauterine.  Given that this pregnancy was IVF, do not suspect ectopic pregnancy.  Patient is afebrile, non-toxic appearing, sitting comfortably on examination table. Vital signs reviewed and stable.  On exam, patient does have some lower abdominal tenderness.  We will plan to check basic labs, pelvic exam. Patient is O positive.   Beta quant is 144, 0 50.  BMP is unremarkable.  CBC shows no significant leukocytosis, anemia.  Urine shows positive hemoglobin, bacteria.  No leukocytes, nitrates, pyuria.  Pelvic exam shows small amount of brown discharge surrounding the cervix.  No vaginal bleeding noted.  No CMT, adnexal mass, tenderness.  Exam is not concerning for PID.  At this time, we do not have ultrasound evaluation at this facility.  Given that there is no concern for ectopic pregnancy, patient does not need emergent ultrasound evaluation but will consult OB/GYN for their recommendation.  Discussed with Dr. Blanca Friend (OB/GYN).  Recommends having patient follow-up in [facility] or with her  own OB/GYN in 1 week for ultrasound evaluation.  Instructed the patient to engage in pelvic rest until follow-up by her doctor.  Updated patient on plan.  Instructed her to follow-up with either her OB/GYN at [facility] for ultrasound evaluation this week.  Since patient has bacteria in her urine, will plan to treat for asymmetric bacteruria.  Patient instructed to engage in pelvic rest.  Vitals stable. Patient had ample opportunity for questions and discussion. All patient's questions were answered with full understanding. Strict return precautions discussed. Patient expresses understanding and agreement to plan.   Final Clinical Impressions(s) / ED Diagnoses   Final diagnoses:  Vaginal bleeding  Bacteria in urine    ED Discharge Orders        Ordered    cephALEXin (KEFLEX) 500 MG capsule  3 times daily     07/16/17 2353       Volanda Napoleon, PA-C 07/17/17 Fleet Contras, MD 07/17/17 [phone]

## 2017-07-16 NOTE — ED Notes (Signed)
Alert, NAD, calm, interactive, resps e/u, speaking in clear complete sentences, no dyspnea noted, skin W&D, VSS, (denies: pain, sob, nausea, dizziness or visual changes).  LMP:  05/14/2017 EDD: 02/18/2018 GA:  9 weeks

## 2017-07-16 NOTE — ED Notes (Signed)
EDP & EDPA at with [place]

## 2017-07-16 NOTE — ED Notes (Signed)
Pelvic exam in progress, chaperone present

## 2017-07-16 NOTE — Discharge Instructions (Signed)
Per discussions with our OB/GYN doctor, you can follow-up with women's health in 1 week to get an ultrasound for evaluation.  In that time, you engage in pelvic rest.  This means no intercourse until cleared by her physician.  You can also follow-up with your OB/GYN.  As we discussed, you can take the recommendations of her fertility doctors in terms of your medication.  As we discussed, there was some bacteria in your urine, please take the antibiotic as directed.  Please return to the emergency department for any fever, worsening vaginal bleeding, lightheaded, dizziness, chest pain, difficulty breathing or any other worsening or concerning symptoms.

## 2017-07-16 NOTE — ED Notes (Signed)
EDP into room, prior to RN assessment, see MD notes, pending orders.   

## 2017-07-18 DIAGNOSIS — O200 Threatened abortion: Secondary | ICD-10-CM | POA: Diagnosis not present

## 2017-07-18 LAB — GC/CHLAMYDIA PROBE AMP (~~LOC~~) NOT AT ARMC
CHLAMYDIA, DNA PROBE: NEGATIVE
NEISSERIA GONORRHEA: NEGATIVE

## 2017-07-20 DIAGNOSIS — Z3481 Encounter for supervision of other normal pregnancy, first trimester: Secondary | ICD-10-CM | POA: Diagnosis not present

## 2017-07-20 DIAGNOSIS — Z3685 Encounter for antenatal screening for Streptococcus B: Secondary | ICD-10-CM | POA: Diagnosis not present

## 2017-07-20 LAB — OB RESULTS CONSOLE RUBELLA ANTIBODY, IGM: Rubella: IMMUNE

## 2017-07-20 LAB — OB RESULTS CONSOLE ANTIBODY SCREEN: ANTIBODY SCREEN: NEGATIVE

## 2017-07-20 LAB — OB RESULTS CONSOLE HIV ANTIBODY (ROUTINE TESTING): HIV: NONREACTIVE

## 2017-07-20 LAB — OB RESULTS CONSOLE GC/CHLAMYDIA
Chlamydia: NEGATIVE
GC PROBE AMP, GENITAL: NEGATIVE

## 2017-07-20 LAB — OB RESULTS CONSOLE ABO/RH: RH Type: POSITIVE

## 2017-07-20 LAB — OB RESULTS CONSOLE RPR: RPR: NONREACTIVE

## 2017-07-20 LAB — OB RESULTS CONSOLE HEPATITIS B SURFACE ANTIGEN: Hepatitis B Surface Ag: NEGATIVE

## 2017-07-21 DIAGNOSIS — F419 Anxiety disorder, unspecified: Secondary | ICD-10-CM | POA: Diagnosis not present

## 2017-07-21 DIAGNOSIS — R030 Elevated blood-pressure reading, without diagnosis of hypertension: Secondary | ICD-10-CM | POA: Diagnosis not present

## 2017-07-21 DIAGNOSIS — Z3A10 10 weeks gestation of pregnancy: Secondary | ICD-10-CM | POA: Diagnosis not present

## 2017-07-26 DIAGNOSIS — O26852 Spotting complicating pregnancy, second trimester: Secondary | ICD-10-CM | POA: Diagnosis not present

## 2017-07-26 DIAGNOSIS — Z113 Encounter for screening for infections with a predominantly sexual mode of transmission: Secondary | ICD-10-CM | POA: Diagnosis not present

## 2017-07-26 DIAGNOSIS — Z3480 Encounter for supervision of other normal pregnancy, unspecified trimester: Secondary | ICD-10-CM | POA: Diagnosis not present

## 2017-07-26 DIAGNOSIS — Z3491 Encounter for supervision of normal pregnancy, unspecified, first trimester: Secondary | ICD-10-CM | POA: Diagnosis not present

## 2017-07-26 DIAGNOSIS — Z3A10 10 weeks gestation of pregnancy: Secondary | ICD-10-CM | POA: Diagnosis not present

## 2017-08-17 DIAGNOSIS — Z3682 Encounter for antenatal screening for nuchal translucency: Secondary | ICD-10-CM | POA: Diagnosis not present

## 2017-08-22 DIAGNOSIS — R030 Elevated blood-pressure reading, without diagnosis of hypertension: Secondary | ICD-10-CM | POA: Diagnosis not present

## 2017-08-22 DIAGNOSIS — F419 Anxiety disorder, unspecified: Secondary | ICD-10-CM | POA: Diagnosis not present

## 2017-08-22 DIAGNOSIS — Z3A14 14 weeks gestation of pregnancy: Secondary | ICD-10-CM | POA: Diagnosis not present

## 2017-09-11 ENCOUNTER — Encounter (HOSPITAL_COMMUNITY): Payer: Self-pay

## 2017-09-11 ENCOUNTER — Inpatient Hospital Stay (HOSPITAL_COMMUNITY)
Admission: AD | Admit: 2017-09-11 | Discharge: 2017-09-11 | Disposition: A | Discharge status: Home / Self Care | Payer: Federal, State, Local not specified - PPO | Source: Ambulatory Visit | Attending: Obstetrics and Gynecology | Admitting: Obstetrics and Gynecology

## 2017-09-11 DIAGNOSIS — Z3A16 16 weeks gestation of pregnancy: Secondary | ICD-10-CM

## 2017-09-11 DIAGNOSIS — Z880 Allergy status to penicillin: Secondary | ICD-10-CM | POA: Insufficient documentation

## 2017-09-11 DIAGNOSIS — O479 False labor, unspecified: Secondary | ICD-10-CM

## 2017-09-11 DIAGNOSIS — O4702 False labor before 37 completed weeks of gestation, second trimester: Secondary | ICD-10-CM | POA: Diagnosis not present

## 2017-09-11 DIAGNOSIS — R109 Unspecified abdominal pain: Secondary | ICD-10-CM | POA: Diagnosis not present

## 2017-09-11 LAB — URINALYSIS, ROUTINE W REFLEX MICROSCOPIC
Bilirubin Urine: NEGATIVE
Glucose, UA: NEGATIVE mg/dL
Hgb urine dipstick: NEGATIVE
Ketones, ur: NEGATIVE mg/dL
LEUKOCYTES UA: NEGATIVE
NITRITE: NEGATIVE
Protein, ur: NEGATIVE mg/dL
SPECIFIC GRAVITY, URINE: 1.018 (ref 1.005–1.030)
pH: 5 (ref 5.0–8.0)

## 2017-09-11 NOTE — MAU Provider Note (Signed)
History     CSN: [id number]  Arrival date and time: 09/11/17 2458   First Provider Initiated Contact with Patient 09/11/17 2050     Chief Complaint  Patient presents with  . Abdominal Pain   HPI [name] is a 47 y.o. G4P3003 at [redacted]w[redacted]d who presents with abdominal pain. She states at 1300 she started feeling cramping in her abdomen. She rates the pain a 5/10 and tried tylenol with some relief. She states she drank water and changed position with no relief. She denies any leaking or bleeding. Denies any problems during the pregnancy.   OB History    Gravida  4   Para  3   Term  3   Preterm      AB      Living  3     SAB      TAB      Ectopic      Multiple      Live Births  3           Past Medical History:  Diagnosis Date  . AMA (advanced maternal age) multigravida 23+   . Anxiety   . Cancer ([place])    skin  . Gestational diabetes    previous pregnancy  . H/O varicella   . Migraine   . PONV (postoperative nausea and vomiting)     Past Surgical History:  Procedure Laterality Date  . CESAREAN SECTION  03/21/2012   Procedure: CESAREAN SECTION;  Surgeon: Allena Katz, MD;  Location: [facility];  Service: Obstetrics;  Laterality: N/A;  . GYNECOLOGIC CRYOSURGERY    . MELANOMA EXCISION  2005   L arm  . WISDOM TOOTH EXTRACTION  1994    Family History  Problem Relation Age of Onset  . Depression Mother   . Hypertension Father   . Diabetes Father   . COPD Maternal Grandmother   . Cancer Maternal Grandmother   . COPD Paternal Grandmother   . Heart attack Paternal Grandfather   . Other Neg Hx     Social History   Tobacco Use  . Smoking status: Never Smoker  . Smokeless tobacco: Never Used  Substance Use Topics  . Alcohol use: No  . Drug use: No    Allergies:  Allergies  Allergen Reactions  . Erythromycin Nausea And Vomiting  . Augmentin [Amoxicillin-Pot Clavulanate] Anxiety    "panic attack"    Medications Prior to Admission   Medication Sig Dispense Refill Last Dose  . acetaminophen (TYLENOL) 500 MG tablet Take 500 mg by mouth every 6 (six) hours as needed.   09/11/2017 at Unknown time  . levothyroxine (SYNTHROID, LEVOTHROID) 50 MCG tablet Take 50 mcg by mouth daily.  0 09/11/2017 at Unknown time  . Prenatal Vit-Fe Fumarate-FA (PRENATAL MULTIVITAMIN) TABS tablet Take 1 tablet by mouth daily at 12 noon.   09/11/2017 at Unknown time  . buPROPion (WELLBUTRIN XL) 150 MG 24 hr tablet Take 150 mg by mouth every morning.  5 Taking  . estradiol (ESTRACE) 2 MG tablet Take 2 mg by mouth 2 (two) times daily.     Marland Kitchen labetalol (NORMODYNE) 100 MG tablet Take 100 mg by mouth 2 (two) times daily.     . Multiple Vitamin (MULTIVITAMIN) tablet Take 1 tablet by mouth daily.   Taking  . PROGESTERONE IM Inject 1 mL into the muscle daily.     . sertraline (ZOLOFT) 50 MG tablet Take 100 mg by mouth daily.  Review of Systems  Constitutional: Negative.  Negative for fatigue and fever.  HENT: Negative.   Respiratory: Negative.  Negative for shortness of breath.   Cardiovascular: Negative.  Negative for chest pain.  Gastrointestinal: Positive for anal bleeding. Negative for abdominal pain, constipation, diarrhea, nausea and vomiting.  Genitourinary: Negative.  Negative for dysuria, vaginal bleeding and vaginal discharge.  Neurological: Negative.  Negative for dizziness and headaches.   Physical Exam   Blood pressure 120/77, pulse 76, temperature 98.2 F (36.8 C), temperature source Oral, resp. rate 18, height 5\' 4"  (1.626 m), weight 214 lb (97.1 kg), last menstrual period 05/14/2017, SpO2 98 %, currently breastfeeding.  Physical Exam  Nursing note and vitals reviewed. Constitutional: She is oriented to person, place, and time. She appears well-developed and well-nourished. No distress.  HENT:  Head: Normocephalic.  Eyes: Pupils are equal, round, and reactive to light.  Cardiovascular: Normal rate, regular rhythm and normal heart  sounds.  Respiratory: Effort normal and breath sounds normal. No respiratory distress.  GI: Soft. Bowel sounds are normal. She exhibits no distension. There is no tenderness.  Neurological: She is alert and oriented to person, place, and time.  Skin: Skin is warm and dry.  Psychiatric: She has a normal mood and affect. Her behavior is normal. Judgment and thought content normal.   Dilation: Closed Effacement (%): Thick Cervical Position: Posterior Exam by:: C Marck Mcclenny CNM  FHT: 152 bpm  MAU Course  Procedures Results for orders placed or performed during the hospital encounter of 09/11/17 (from the past 24 hour(s))  Urinalysis, Routine w reflex microscopic     Status: Abnormal   Collection Time: 09/11/17  8:04 PM  Result Value Ref Range   Color, Urine YELLOW YELLOW   APPearance HAZY (A) CLEAR   Specific Gravity, Urine 1.018 1.005 - 1.030   pH 5.0 5.0 - 8.0   Glucose, UA NEGATIVE NEGATIVE mg/dL   Hgb urine dipstick NEGATIVE NEGATIVE   Bilirubin Urine NEGATIVE NEGATIVE   Ketones, ur NEGATIVE NEGATIVE mg/dL   Protein, ur NEGATIVE NEGATIVE mg/dL   Nitrite NEGATIVE NEGATIVE   Leukocytes, UA NEGATIVE NEGATIVE   MDM UA Consulted with Dr. Helane Rima- ok to discharge home with reassurance.  Assessment and Plan   1. Braxton Hicks contractions   2. [redacted] weeks gestation of pregnancy    -Discharge home in stable condition -Abdominal pain precautions discussed -Patient advised to follow-up with [facility] as scheduled for prenatal care -Patient may return to MAU as needed or if her condition were to change or worsen   Wende Mott CNM 09/11/2017, 8:50 PM

## 2017-09-11 NOTE — MAU Note (Addendum)
Pt reports cramping and lower back pain that comes and goes. Pt is unsure if she has a stomach bug or if she is having contractions. Denies bleeding or leaking. Had a normal BM today. Pt took tylenol for L leg pain and had some relief. Pt is very anxious about this pregnancy and wants to make sure everything is ok.[name]

## 2017-09-11 NOTE — Discharge Instructions (Signed)
Braxton Hicks Contractions °Contractions of the uterus can occur throughout pregnancy, but they are not always a sign that you are in labor. You may have practice contractions called Braxton Hicks contractions. These false labor contractions are sometimes confused with true labor. °What are Braxton Hicks contractions? °Braxton Hicks contractions are tightening movements that occur in the muscles of the uterus before labor. Unlike true labor contractions, these contractions do not result in opening (dilation) and thinning of the cervix. Toward the end of pregnancy (32-34 weeks), Braxton Hicks contractions can happen more often and may become stronger. These contractions are sometimes difficult to tell apart from true labor because they can be very uncomfortable. You should not feel embarrassed if you go to the hospital with false labor. °Sometimes, the only way to tell if you are in true labor is for your health care provider to look for changes in the cervix. The health care provider will do a physical exam and may monitor your contractions. If you are not in true labor, the exam should show that your cervix is not dilating and your water has not broken. °If there are other health problems associated with your pregnancy, it is completely safe for you to be sent home with false labor. You may continue to have Braxton Hicks contractions until you go into true labor. °How to tell the difference between true labor and false labor °True labor °· Contractions last 30-70 seconds. °· Contractions become very regular. °· Discomfort is usually felt in the top of the uterus, and it spreads to the lower abdomen and low back. °· Contractions do not go away with walking. °· Contractions usually become more intense and increase in frequency. °· The cervix dilates and gets thinner. °False labor °· Contractions are usually shorter and not as strong as true labor contractions. °· Contractions are usually irregular. °· Contractions  are often felt in the front of the lower abdomen and in the groin. °· Contractions may go away when you walk around or change positions while lying down. °· Contractions get weaker and are shorter-lasting as time goes on. °· The cervix usually does not dilate or become thin. °Follow these instructions at home: °· Take over-the-counter and prescription medicines only as told by your health care provider. °· Keep up with your usual exercises and follow other instructions from your health care provider. °· Eat and drink lightly if you think you are going into labor. °· If Braxton Hicks contractions are making you uncomfortable: °? Change your position from lying down or resting to walking, or change from walking to resting. °? Sit and rest in a tub of warm water. °? Drink enough fluid to keep your urine pale yellow. Dehydration may cause these contractions. °? Do slow and deep breathing several times an hour. °· Keep all follow-up prenatal visits as told by your health care provider. This is important. °Contact a health care provider if: °· You have a fever. °· You have continuous pain in your abdomen. °Get help right away if: °· Your contractions become stronger, more regular, and closer together. °· You have fluid leaking or gushing from your vagina. °· You pass blood-tinged mucus (bloody show). °· You have bleeding from your vagina. °· You have low back pain that you never had before. °· You feel your baby’s head pushing down and causing pelvic pressure. °· Your baby is not moving inside you as much as it used to. °Summary °· Contractions that occur before labor are called Braxton   Hicks contractions, false labor, or practice contractions. °· Braxton Hicks contractions are usually shorter, weaker, farther apart, and less regular than true labor contractions. True labor contractions usually become progressively stronger and regular and they become more frequent. °· Manage discomfort from Braxton Hicks contractions by  changing position, resting in a warm bath, drinking plenty of water, or practicing deep breathing. °This information is not intended to replace advice given to you by your health care provider. Make sure you discuss any questions you have with your health care provider. °Document Released: 09/09/2016 Document Revised: 09/09/2016 Document Reviewed: 09/09/2016 °Elsevier Interactive Patient Education © 2018 [employer] ° °

## 2017-09-11 NOTE — MAU Note (Signed)
Pt reports lower abdominal pain and cramping and dull achy pain on right hip and leg that started earlier today around 1pm. States the pain comes and goes. Denies vaginal bleeding or discharge. Rates 4/10. Reports some loose stools today. Pt denies fever or any sick contacts.

## 2017-09-21 DIAGNOSIS — Z363 Encounter for antenatal screening for malformations: Secondary | ICD-10-CM | POA: Diagnosis not present

## 2017-09-21 DIAGNOSIS — Z3A18 18 weeks gestation of pregnancy: Secondary | ICD-10-CM | POA: Diagnosis not present

## 2017-10-30 ENCOUNTER — Encounter (HOSPITAL_COMMUNITY): Payer: Self-pay | Admitting: *Deleted

## 2017-10-30 ENCOUNTER — Other Ambulatory Visit: Payer: Self-pay

## 2017-10-30 ENCOUNTER — Inpatient Hospital Stay (HOSPITAL_COMMUNITY)
Admission: AD | Admit: 2017-10-30 | Discharge: 2017-10-30 | Disposition: A | Discharge status: Home / Self Care | Payer: Federal, State, Local not specified - PPO | Source: Ambulatory Visit | Attending: Obstetrics and Gynecology | Admitting: Obstetrics and Gynecology

## 2017-10-30 DIAGNOSIS — O26892 Other specified pregnancy related conditions, second trimester: Secondary | ICD-10-CM | POA: Diagnosis not present

## 2017-10-30 DIAGNOSIS — O09522 Supervision of elderly multigravida, second trimester: Secondary | ICD-10-CM | POA: Insufficient documentation

## 2017-10-30 DIAGNOSIS — R197 Diarrhea, unspecified: Secondary | ICD-10-CM | POA: Diagnosis not present

## 2017-10-30 DIAGNOSIS — Z3A23 23 weeks gestation of pregnancy: Secondary | ICD-10-CM | POA: Insufficient documentation

## 2017-10-30 DIAGNOSIS — R109 Unspecified abdominal pain: Secondary | ICD-10-CM | POA: Insufficient documentation

## 2017-10-30 DIAGNOSIS — O26899 Other specified pregnancy related conditions, unspecified trimester: Secondary | ICD-10-CM

## 2017-10-30 DIAGNOSIS — K529 Noninfective gastroenteritis and colitis, unspecified: Secondary | ICD-10-CM | POA: Diagnosis not present

## 2017-10-30 LAB — URINALYSIS, ROUTINE W REFLEX MICROSCOPIC
Bilirubin Urine: NEGATIVE
Glucose, UA: NEGATIVE mg/dL
Hgb urine dipstick: NEGATIVE
Ketones, ur: 5 mg/dL — AB
Leukocytes, UA: NEGATIVE
Nitrite: NEGATIVE
Protein, ur: NEGATIVE mg/dL
Specific Gravity, Urine: 1.012 (ref 1.005–1.030)
pH: 6 (ref 5.0–8.0)

## 2017-10-30 NOTE — MAU Note (Signed)
Pt. States 4 BM today. Reports 30 min after eating. States they are "soft and mushy bowel movement". States she feels mild cramping. Is worried about preterm labor. Denies ctx, vaginal bleeding and discharge.

## 2017-10-30 NOTE — MAU Provider Note (Signed)
Chief Complaint:  Diarrhea and Abdominal Pain   First Provider Initiated Contact with Patient 10/30/17 2135      HPI: [name] is a 47 y.o. G4P3003 at 55w6dwho presents to maternity admissions reporting abdominal pain and diarrhea. She reports symptoms started occurring last night. She reports abdominal pain as lower abdominal cramping, rates pain 1/10 - has not taken any medication for abdominal pain. She reports pain gets worse 30 minutes after she eats right prior to having diarrhea. She reports the diarrhea has occurred 4 times in the past 24 hours, reports it occurring 30 minutes after eating, describes as soft and mushy. She reports going to a pool party yesterday where there was a lot of finger foods and children. She reports symptoms started shortly after getting home but denies husband or other children in her house being sick. She reports looking up symptoms on [employer] and is concerned about preterm labor. She denies hx of PTL or PTD in prior pregnancies.  She reports good fetal movement, denies LOF, vaginal bleeding, vaginal itching/burning, urinary symptoms, h/a, dizziness, n/v, or fever/chills. Has appointment scheduled in the office on 7/10.  Past Medical History: Past Medical History:  Diagnosis Date  . AMA (advanced maternal age) multigravida 73+   . Anxiety   . Cancer ([place])    skin  . Gestational diabetes    previous pregnancy  . H/O varicella   . Migraine   . PONV (postoperative nausea and vomiting)     Past obstetric history: OB History  Gravida Para Term Preterm AB Living  4 3 3     3   SAB TAB Ectopic Multiple Live Births          3    # Outcome Date GA Lbr Len/2nd Weight Sex Delivery Anes PTL Lv  4 Current           3 Term 03/21/12 [redacted]w[redacted]d  9 lb 7 oz (4.281 kg) M CS-LTranv EPI  LIV  2 Term 2005 [redacted]w[redacted]d 10:00 8 lb 8 oz (3.856 kg) M Vag-Spont EPI N LIV  1 Term 1999 [redacted]w[redacted]d 05:30 7 lb 10 oz (3.459 kg) M Vag-Spont None N LIV    Past Surgical History: Past  Surgical History:  Procedure Laterality Date  . CESAREAN SECTION  03/21/2012   Procedure: CESAREAN SECTION;  Surgeon: Allena Katz, MD;  Location: [facility];  Service: Obstetrics;  Laterality: N/A;  . GYNECOLOGIC CRYOSURGERY    . MELANOMA EXCISION  2005   L arm  . WISDOM TOOTH EXTRACTION  1994    Family History: Family History  Problem Relation Age of Onset  . Depression Mother   . Hypertension Father   . Diabetes Father   . COPD Maternal Grandmother   . Cancer Maternal Grandmother   . COPD Paternal Grandmother   . Heart attack Paternal Grandfather   . Other Neg Hx     Social History: Social History   Tobacco Use  . Smoking status: Never Smoker  . Smokeless tobacco: Never Used  Substance Use Topics  . Alcohol use: No  . Drug use: No    Allergies:  Allergies  Allergen Reactions  . Erythromycin Nausea And Vomiting  . Augmentin [Amoxicillin-Pot Clavulanate] Anxiety    "panic attack"    Meds:  Medications Prior to Admission  Medication Sig Dispense Refill Last Dose  . acetaminophen (TYLENOL) 500 MG tablet Take 500 mg by mouth every 6 (six) hours as needed.   10/29/2017 at Unknown time  .  levothyroxine (SYNTHROID, LEVOTHROID) 50 MCG tablet Take 50 mcg by mouth daily.  0 10/30/2017 at Unknown time  . Prenatal Vit-Fe Fumarate-FA (PRENATAL MULTIVITAMIN) TABS tablet Take 1 tablet by mouth daily at 12 noon.   10/30/2017 at Unknown time  . sertraline (ZOLOFT) 50 MG tablet Take 100 mg by mouth daily.    10/30/2017 at Unknown time  . buPROPion (WELLBUTRIN XL) 150 MG 24 hr tablet Take 150 mg by mouth every morning.  5 More than a month at Unknown time  . labetalol (NORMODYNE) 100 MG tablet Take 100 mg by mouth 2 (two) times daily.   More than a month at Unknown time  . Multiple Vitamin (MULTIVITAMIN) tablet Take 1 tablet by mouth daily.   More than a month at Unknown time    ROS:  Review of Systems  Respiratory: Negative.   Cardiovascular: Negative.   Gastrointestinal:  Positive for abdominal pain and diarrhea. Negative for constipation, nausea and vomiting.  Genitourinary: Negative.    I have reviewed patient's Past Medical Hx, Surgical Hx, Family Hx, Social Hx, medications and allergies.   Physical Exam   Patient Vitals for the past 24 hrs:  BP Temp Temp src Pulse Resp SpO2 Height Weight  10/30/17 2211 125/75 - - 88 19 - - -  10/30/17 2022 139/84 98.4 F (36.9 C) Oral 87 16 100 % 5\' 4"  (1.626 m) 224 lb (101.6 kg)   Constitutional: Well-developed, well-nourished female in no acute distress.  Cardiovascular: normal rate Respiratory: normal effort GI: Abd soft, non-tender, gravid appropriate for gestational age.  MS: Extremities nontender, no edema, normal ROM Neurologic: Alert and oriented x 4.   CERVICAL EXAM Dilation: Closed Effacement (%): Thick Cervical Position: Posterior Exam by:: Darrol Poke, CNM  FHT:  Baseline 140 , moderate variability, accelerations present, no decelerations Contractions: NONE   Labs: Results for orders placed or performed during the hospital encounter of 10/30/17 (from the past 24 hour(s))  Urinalysis, Routine w reflex microscopic     Status: Abnormal   Collection Time: 10/30/17  8:28 PM  Result Value Ref Range   Color, Urine YELLOW YELLOW   APPearance CLEAR CLEAR   Specific Gravity, Urine 1.012 1.005 - 1.030   pH 6.0 5.0 - 8.0   Glucose, UA NEGATIVE NEGATIVE mg/dL   Hgb urine dipstick NEGATIVE NEGATIVE   Bilirubin Urine NEGATIVE NEGATIVE   Ketones, ur 5 (A) NEGATIVE mg/dL   Protein, ur NEGATIVE NEGATIVE mg/dL   Nitrite NEGATIVE NEGATIVE   Leukocytes, UA NEGATIVE NEGATIVE   MAU Course/MDM: Orders Placed This Encounter  Procedures  . Urinalysis, Routine w reflex microscopic  . Discharge patient Discharge disposition: 01-Home or Self Care; Discharge patient date: 10/30/2017   UA- small amount of ketones present in urine, otherwise negative.  NST reviewed- reactive for gestational age  Consult Dr  Julien Girt with presentation, exam findings and test results. Recommends imodium, probiotics and hydration for home use. Okay to discharge home with follow up in the office as scheduled.  Offered imodium prescription for patient, patient declines prescription at this time and does not want to be constipated. She reports she will pick up imodium OTC if diarrhea does not resolve in 48 hours. Educated on staying hydrated and increasing amount of water consumption. Encouraged to drink at least 8 cups of water per day. Patient verbalizes understanding- she reports only drinking 1-2 cups per day. Discussed reasons to return to MAU as needed for evaluation.  Pt discharge. Pt stable at time of discharge.  Today's evaluation included a work-up for preterm labor which can be life-threatening for both mom and baby.  Assessment: 1. Gastroenteritis, acute   2. Diarrhea during pregnancy   3. Abdominal cramping affecting pregnancy     Plan: Discharge home Preterm Labor precautions and fetal kick counts Follow up as scheduled for prenatal appointments  Return to MAU as needed  OTC use of imodium Discussed use of probiotics and increasing hydration   Follow-up Information    [place] Follow up.   Why:  Follow up as scheduled for prenatal appointments and return to MAU as needed for emergencies  Contact information: [address] [place] [address] [phone]           Allergies as of 10/30/2017      Reactions   Erythromycin Nausea And Vomiting   Augmentin [amoxicillin-pot Clavulanate] Anxiety   "panic attack"      Medication List    TAKE these medications   acetaminophen 500 MG tablet Commonly known as:  TYLENOL Take 500 mg by mouth every 6 (six) hours as needed.   buPROPion 150 MG 24 hr tablet Commonly known as:  WELLBUTRIN XL Take 150 mg by mouth every morning.   labetalol 100 MG tablet Commonly known as:  NORMODYNE Take 100 mg by  mouth 2 (two) times daily.   levothyroxine 50 MCG tablet Commonly known as:  SYNTHROID, LEVOTHROID Take 50 mcg by mouth daily.   multivitamin tablet Take 1 tablet by mouth daily.   prenatal multivitamin Tabs tablet Take 1 tablet by mouth daily at 12 noon.   sertraline 50 MG tablet Commonly known as:  ZOLOFT Take 100 mg by mouth daily.       Darrol Poke Certified Nurse-Midwife 10/30/2017 10:37 PM

## 2017-10-30 NOTE — MAU Note (Signed)
Pt reports loose stools since last night. Every time she eats 40min-1 hour later she has to have a BM. Pt also having some abdominal cramping, wants to be sure she isn't having preterm labor.

## 2017-11-01 DIAGNOSIS — D225 Melanocytic nevi of trunk: Secondary | ICD-10-CM | POA: Diagnosis not present

## 2017-11-01 DIAGNOSIS — Z85820 Personal history of malignant melanoma of skin: Secondary | ICD-10-CM | POA: Diagnosis not present

## 2017-11-01 DIAGNOSIS — L814 Other melanin hyperpigmentation: Secondary | ICD-10-CM | POA: Diagnosis not present

## 2017-11-01 DIAGNOSIS — Z86018 Personal history of other benign neoplasm: Secondary | ICD-10-CM | POA: Diagnosis not present

## 2017-11-16 DIAGNOSIS — O4402 Placenta previa specified as without hemorrhage, second trimester: Secondary | ICD-10-CM | POA: Diagnosis not present

## 2017-11-16 DIAGNOSIS — Z3480 Encounter for supervision of other normal pregnancy, unspecified trimester: Secondary | ICD-10-CM | POA: Diagnosis not present

## 2017-11-16 DIAGNOSIS — Z23 Encounter for immunization: Secondary | ICD-10-CM | POA: Diagnosis not present

## 2017-11-16 DIAGNOSIS — Z3A26 26 weeks gestation of pregnancy: Secondary | ICD-10-CM | POA: Diagnosis not present

## 2017-11-21 DIAGNOSIS — R202 Paresthesia of skin: Secondary | ICD-10-CM | POA: Diagnosis not present

## 2017-11-21 DIAGNOSIS — Z3A27 27 weeks gestation of pregnancy: Secondary | ICD-10-CM | POA: Diagnosis not present

## 2017-11-21 DIAGNOSIS — F419 Anxiety disorder, unspecified: Secondary | ICD-10-CM | POA: Diagnosis not present

## 2017-11-29 ENCOUNTER — Other Ambulatory Visit (HOSPITAL_COMMUNITY): Payer: Self-pay

## 2017-11-30 ENCOUNTER — Ambulatory Visit (HOSPITAL_COMMUNITY)
Admission: RE | Admit: 2017-11-30 | Discharge: 2017-11-30 | Disposition: A | Discharge status: Home / Self Care | Payer: Federal, State, Local not specified - PPO | Source: Ambulatory Visit | Attending: Obstetrics and Gynecology | Admitting: Obstetrics and Gynecology

## 2017-11-30 DIAGNOSIS — O09529 Supervision of elderly multigravida, unspecified trimester: Secondary | ICD-10-CM | POA: Diagnosis not present

## 2017-11-30 DIAGNOSIS — D649 Anemia, unspecified: Secondary | ICD-10-CM | POA: Diagnosis not present

## 2017-11-30 DIAGNOSIS — Z3A00 Weeks of gestation of pregnancy not specified: Secondary | ICD-10-CM | POA: Insufficient documentation

## 2017-11-30 DIAGNOSIS — O99019 Anemia complicating pregnancy, unspecified trimester: Secondary | ICD-10-CM | POA: Diagnosis not present

## 2017-11-30 DIAGNOSIS — O99280 Endocrine, nutritional and metabolic diseases complicating pregnancy, unspecified trimester: Secondary | ICD-10-CM | POA: Insufficient documentation

## 2017-11-30 MED ORDER — SODIUM CHLORIDE 0.9 % IV SOLN
510.0000 mg | INTRAVENOUS | Status: DC
  Administered 2017-11-30: 510 mg via INTRAVENOUS
  Filled 2017-11-30: qty 17

## 2017-11-30 NOTE — Discharge Instructions (Signed)

## 2017-11-30 NOTE — Progress Notes (Signed)
Dr Gaetano Net calls back with no new orders, d/c iv feraheme, recheck vitals and d/c in 15 minutes in WNL

## 2017-11-30 NOTE — Progress Notes (Signed)
Pt c/o flushed, hot, prickling pain all over, light headed.  Dr Gaetano Net notified at [phone]

## 2017-12-06 ENCOUNTER — Encounter (HOSPITAL_COMMUNITY): Payer: Federal, State, Local not specified - PPO

## 2017-12-12 DIAGNOSIS — O403XX0 Polyhydramnios, third trimester, not applicable or unspecified: Secondary | ICD-10-CM | POA: Diagnosis not present

## 2017-12-12 DIAGNOSIS — Z3A30 30 weeks gestation of pregnancy: Secondary | ICD-10-CM | POA: Diagnosis not present

## 2017-12-21 DIAGNOSIS — D649 Anemia, unspecified: Secondary | ICD-10-CM | POA: Diagnosis not present

## 2017-12-21 DIAGNOSIS — O403XX0 Polyhydramnios, third trimester, not applicable or unspecified: Secondary | ICD-10-CM | POA: Diagnosis not present

## 2017-12-21 DIAGNOSIS — Z3A31 31 weeks gestation of pregnancy: Secondary | ICD-10-CM | POA: Diagnosis not present

## 2017-12-28 DIAGNOSIS — Z3480 Encounter for supervision of other normal pregnancy, unspecified trimester: Secondary | ICD-10-CM | POA: Diagnosis not present

## 2018-01-04 DIAGNOSIS — Z3A33 33 weeks gestation of pregnancy: Secondary | ICD-10-CM | POA: Diagnosis not present

## 2018-01-04 DIAGNOSIS — O403XX0 Polyhydramnios, third trimester, not applicable or unspecified: Secondary | ICD-10-CM | POA: Diagnosis not present

## 2018-01-11 DIAGNOSIS — O9989 Other specified diseases and conditions complicating pregnancy, childbirth and the puerperium: Secondary | ICD-10-CM | POA: Diagnosis not present

## 2018-01-11 DIAGNOSIS — Z3A34 34 weeks gestation of pregnancy: Secondary | ICD-10-CM | POA: Diagnosis not present

## 2018-01-18 DIAGNOSIS — O9989 Other specified diseases and conditions complicating pregnancy, childbirth and the puerperium: Secondary | ICD-10-CM | POA: Diagnosis not present

## 2018-01-18 DIAGNOSIS — Z3685 Encounter for antenatal screening for Streptococcus B: Secondary | ICD-10-CM | POA: Diagnosis not present

## 2018-01-18 DIAGNOSIS — Z3A35 35 weeks gestation of pregnancy: Secondary | ICD-10-CM | POA: Diagnosis not present

## 2018-01-23 ENCOUNTER — Telehealth (HOSPITAL_COMMUNITY): Payer: Self-pay | Admitting: *Deleted

## 2018-01-23 DIAGNOSIS — R5383 Other fatigue: Secondary | ICD-10-CM | POA: Diagnosis not present

## 2018-01-23 DIAGNOSIS — D649 Anemia, unspecified: Secondary | ICD-10-CM | POA: Diagnosis not present

## 2018-01-23 DIAGNOSIS — Z3A36 36 weeks gestation of pregnancy: Secondary | ICD-10-CM | POA: Diagnosis not present

## 2018-01-23 DIAGNOSIS — O368330 Maternal care for abnormalities of the fetal heart rate or rhythm, third trimester, not applicable or unspecified: Secondary | ICD-10-CM | POA: Diagnosis not present

## 2018-01-23 NOTE — Telephone Encounter (Signed)
Preadmission screen  

## 2018-01-24 ENCOUNTER — Encounter (HOSPITAL_COMMUNITY): Payer: Self-pay

## 2018-01-30 DIAGNOSIS — O403XX0 Polyhydramnios, third trimester, not applicable or unspecified: Secondary | ICD-10-CM | POA: Diagnosis not present

## 2018-01-30 DIAGNOSIS — Z3A37 37 weeks gestation of pregnancy: Secondary | ICD-10-CM | POA: Diagnosis not present

## 2018-01-30 DIAGNOSIS — O10013 Pre-existing essential hypertension complicating pregnancy, third trimester: Secondary | ICD-10-CM | POA: Diagnosis not present

## 2018-02-02 DIAGNOSIS — Z3A37 37 weeks gestation of pregnancy: Secondary | ICD-10-CM | POA: Diagnosis not present

## 2018-02-02 DIAGNOSIS — O368330 Maternal care for abnormalities of the fetal heart rate or rhythm, third trimester, not applicable or unspecified: Secondary | ICD-10-CM | POA: Diagnosis not present

## 2018-02-03 NOTE — Patient Instructions (Signed)
[name]  02/03/2018   Your procedure is scheduled on:  02/07/2018  Enter through the Main Entrance of [facility] up the phone at the desk and dial [phone]  Call this number if you have problems the morning of surgery:[phone]  Remember:   Do not eat food:(After Midnight) Desps de medianoche.  Do not drink clear liquids: (After Midnight) Desps de medianoche.  Take these medicines the morning of surgery with A SIP OF WATER: synthroid and zoloft   Do not wear jewelry, make-up or nail polish.  Do not wear lotions, powders, or perfumes. Do not wear deodorant.  Do not shave 48 hours prior to surgery.  Do not bring valuables to the hospital.  [facility] is not   responsible for any belongings or valuables brought to the hospital.  Contacts, dentures or bridgework may not be worn into surgery.  Leave suitcase in the car. After surgery it may be brought to your room.  For patients admitted to the hospital, checkout time is 11:00 AM the day of              discharge.    N/A   Please read over the following fact sheets that you were given:   Surgical Site Infection Prevention

## 2018-02-06 ENCOUNTER — Encounter (HOSPITAL_COMMUNITY)
Admission: RE | Admit: 2018-02-06 | Discharge: 2018-02-06 | Disposition: A | Discharge status: Home / Self Care | Payer: Federal, State, Local not specified - PPO | Source: Ambulatory Visit | Attending: Obstetrics and Gynecology | Admitting: Obstetrics and Gynecology

## 2018-02-06 DIAGNOSIS — O321XX0 Maternal care for breech presentation, not applicable or unspecified: Secondary | ICD-10-CM | POA: Diagnosis not present

## 2018-02-06 DIAGNOSIS — Z23 Encounter for immunization: Secondary | ICD-10-CM | POA: Diagnosis not present

## 2018-02-06 DIAGNOSIS — Z3A38 38 weeks gestation of pregnancy: Secondary | ICD-10-CM | POA: Diagnosis not present

## 2018-02-06 DIAGNOSIS — O134 Gestational [pregnancy-induced] hypertension without significant proteinuria, complicating childbirth: Secondary | ICD-10-CM | POA: Diagnosis not present

## 2018-02-06 DIAGNOSIS — O99284 Endocrine, nutritional and metabolic diseases complicating childbirth: Secondary | ICD-10-CM | POA: Diagnosis not present

## 2018-02-06 DIAGNOSIS — O99214 Obesity complicating childbirth: Secondary | ICD-10-CM | POA: Diagnosis not present

## 2018-02-06 DIAGNOSIS — O34211 Maternal care for low transverse scar from previous cesarean delivery: Secondary | ICD-10-CM | POA: Diagnosis not present

## 2018-02-06 DIAGNOSIS — O3413 Maternal care for benign tumor of corpus uteri, third trimester: Secondary | ICD-10-CM | POA: Diagnosis not present

## 2018-02-06 DIAGNOSIS — D252 Subserosal leiomyoma of uterus: Secondary | ICD-10-CM | POA: Diagnosis not present

## 2018-02-06 DIAGNOSIS — E039 Hypothyroidism, unspecified: Secondary | ICD-10-CM | POA: Diagnosis not present

## 2018-02-06 LAB — CBC
HEMATOCRIT: 34 % — AB (ref 36.0–46.0)
Hemoglobin: 11 g/dL — ABNORMAL LOW (ref 12.0–15.0)
MCH: 28.4 pg (ref 26.0–34.0)
MCHC: 32.4 g/dL (ref 30.0–36.0)
MCV: 87.6 fL (ref 78.0–100.0)
Platelets: 152 10*3/uL (ref 150–400)
RBC: 3.88 MIL/uL (ref 3.87–5.11)
RDW: 14.9 % (ref 11.5–15.5)
WBC: 7.6 10*3/uL (ref 4.0–10.5)

## 2018-02-06 LAB — COMPREHENSIVE METABOLIC PANEL
ALT: 12 U/L (ref 0–44)
AST: 17 U/L (ref 15–41)
Albumin: 2.6 g/dL — ABNORMAL LOW (ref 3.5–5.0)
Alkaline Phosphatase: 158 U/L — ABNORMAL HIGH (ref 38–126)
Anion gap: 5 (ref 5–15)
BILIRUBIN TOTAL: 0.6 mg/dL (ref 0.3–1.2)
BUN: 8 mg/dL (ref 6–20)
CO2: 25 mmol/L (ref 22–32)
CREATININE: 0.56 mg/dL (ref 0.44–1.00)
Calcium: 8.8 mg/dL — ABNORMAL LOW (ref 8.9–10.3)
Chloride: 107 mmol/L (ref 98–111)
GFR calc Af Amer: >60 mL/min (ref >60)
Glucose, Bld: 76 mg/dL (ref 70–99)
POTASSIUM: 4.4 mmol/L (ref 3.5–5.1)
Sodium: 137 mmol/L (ref 135–145)
TOTAL PROTEIN: 5.8 g/dL — AB (ref 6.5–8.1)

## 2018-02-06 LAB — TYPE AND SCREEN
ABO/RH(D): O POS
Antibody Screen: NEGATIVE

## 2018-02-06 NOTE — Anesthesia Preprocedure Evaluation (Addendum)
Anesthesia Evaluation  Patient identified by MRN, date of birth, ID band Patient awake    Reviewed: Allergy & Precautions, H&P , NPO status , Patient's Chart, lab work & pertinent test results  History of Anesthesia Complications (+) PONV and history of anesthetic complications  Airway Mallampati: III  TM Distance: >3 FB Neck ROM: full    Dental no notable dental hx.    Pulmonary neg pulmonary ROS,    Pulmonary exam normal        Cardiovascular negative cardio ROS Normal cardiovascular exam     Neuro/Psych negative neurological ROS     GI/Hepatic negative GI ROS, Neg liver ROS,   Endo/Other  diabetesHypothyroidism Morbid obesity  Renal/GU negative Renal ROS  negative genitourinary   Musculoskeletal negative musculoskeletal ROS (+)   Abdominal (+) + obese,   Peds negative pediatric ROS (+)  Hematology negative hematology ROS (+)   Anesthesia Other Findings   Reproductive/Obstetrics (+) Pregnancy                             Anesthesia Physical  Anesthesia Plan  ASA: III  Anesthesia Plan: Spinal   Post-op Pain Management:    Induction:   PONV Risk Score and Plan: 3 and Ondansetron, Dexamethasone, Treatment may vary due to age or medical condition and Scopolamine patch - Pre-op  Airway Management Planned:   Additional Equipment:   Intra-op Plan:   Post-operative Plan:   Informed Consent: I have reviewed the patients History and Physical, chart, labs and discussed the procedure including the risks, benefits and alternatives for the proposed anesthesia with the patient or authorized representative who has indicated his/her understanding and acceptance.     Plan Discussed with:   Anesthesia Plan Comments:        Anesthesia Quick Evaluation

## 2018-02-07 ENCOUNTER — Inpatient Hospital Stay (HOSPITAL_COMMUNITY): Payer: Federal, State, Local not specified - PPO | Admitting: Anesthesiology

## 2018-02-07 ENCOUNTER — Encounter (HOSPITAL_COMMUNITY): Payer: Self-pay | Admitting: *Deleted

## 2018-02-07 ENCOUNTER — Encounter (HOSPITAL_COMMUNITY)
Admission: RE | Disposition: A | Discharge status: Home / Self Care | Payer: Self-pay | Source: Home / Self Care | Attending: Obstetrics and Gynecology

## 2018-02-07 ENCOUNTER — Inpatient Hospital Stay (HOSPITAL_COMMUNITY)
Admission: RE | Admit: 2018-02-07 | Discharge: 2018-02-10 | DRG: 783 | Disposition: A | Discharge status: Home / Self Care | Payer: Federal, State, Local not specified - PPO | Attending: Obstetrics and Gynecology | Admitting: Obstetrics and Gynecology

## 2018-02-07 DIAGNOSIS — Z98891 History of uterine scar from previous surgery: Secondary | ICD-10-CM

## 2018-02-07 DIAGNOSIS — O321XX0 Maternal care for breech presentation, not applicable or unspecified: Secondary | ICD-10-CM | POA: Diagnosis present

## 2018-02-07 DIAGNOSIS — D252 Subserosal leiomyoma of uterus: Secondary | ICD-10-CM | POA: Diagnosis present

## 2018-02-07 DIAGNOSIS — Z3A38 38 weeks gestation of pregnancy: Secondary | ICD-10-CM

## 2018-02-07 DIAGNOSIS — O34211 Maternal care for low transverse scar from previous cesarean delivery: Secondary | ICD-10-CM | POA: Diagnosis not present

## 2018-02-07 DIAGNOSIS — O134 Gestational [pregnancy-induced] hypertension without significant proteinuria, complicating childbirth: Secondary | ICD-10-CM | POA: Diagnosis present

## 2018-02-07 DIAGNOSIS — O169 Unspecified maternal hypertension, unspecified trimester: Secondary | ICD-10-CM | POA: Diagnosis not present

## 2018-02-07 DIAGNOSIS — O3413 Maternal care for benign tumor of corpus uteri, third trimester: Secondary | ICD-10-CM | POA: Diagnosis present

## 2018-02-07 DIAGNOSIS — O99214 Obesity complicating childbirth: Secondary | ICD-10-CM | POA: Diagnosis present

## 2018-02-07 DIAGNOSIS — O99284 Endocrine, nutritional and metabolic diseases complicating childbirth: Secondary | ICD-10-CM | POA: Diagnosis present

## 2018-02-07 DIAGNOSIS — Z23 Encounter for immunization: Secondary | ICD-10-CM

## 2018-02-07 DIAGNOSIS — N7011 Chronic salpingitis: Secondary | ICD-10-CM | POA: Diagnosis not present

## 2018-02-07 DIAGNOSIS — E039 Hypothyroidism, unspecified: Secondary | ICD-10-CM | POA: Diagnosis present

## 2018-02-07 DIAGNOSIS — Z3A00 Weeks of gestation of pregnancy not specified: Secondary | ICD-10-CM | POA: Diagnosis not present

## 2018-02-07 DIAGNOSIS — O9989 Other specified diseases and conditions complicating pregnancy, childbirth and the puerperium: Secondary | ICD-10-CM | POA: Diagnosis not present

## 2018-02-07 DIAGNOSIS — Z302 Encounter for sterilization: Secondary | ICD-10-CM | POA: Diagnosis not present

## 2018-02-07 LAB — CBC
HCT: 27 % — ABNORMAL LOW (ref 36.0–46.0)
Hemoglobin: 9.1 g/dL — ABNORMAL LOW (ref 12.0–15.0)
MCH: 29.2 pg (ref 26.0–34.0)
MCHC: 33.7 g/dL (ref 30.0–36.0)
MCV: 86.5 fL (ref 78.0–100.0)
PLATELETS: 132 10*3/uL — AB (ref 150–400)
RBC: 3.12 MIL/uL — ABNORMAL LOW (ref 3.87–5.11)
RDW: 14.8 % (ref 11.5–15.5)
WBC: 14.9 10*3/uL — ABNORMAL HIGH (ref 4.0–10.5)

## 2018-02-07 LAB — RPR: RPR Ser Ql: NONREACTIVE

## 2018-02-07 SURGERY — Surgical Case
Anesthesia: Spinal

## 2018-02-07 MED ORDER — NALOXONE HCL 4 MG/10ML IJ SOLN
1.0000 ug/kg/h | INTRAVENOUS | Status: DC | PRN
  Filled 2018-02-07: qty 5

## 2018-02-07 MED ORDER — KETOROLAC TROMETHAMINE 30 MG/ML IJ SOLN: 30.0000 mg | Freq: Four times a day (QID) | INTRAMUSCULAR | Status: AC | PRN

## 2018-02-07 MED ORDER — STERILE WATER FOR IRRIGATION IR SOLN
Status: DC | PRN
  Administered 2018-02-07: 1000 mL

## 2018-02-07 MED ORDER — SIMETHICONE 80 MG PO CHEW: 80.0000 mg | CHEWABLE_TABLET | ORAL | Status: DC | PRN

## 2018-02-07 MED ORDER — MEPERIDINE HCL 25 MG/ML IJ SOLN: 6.2500 mg | INTRAMUSCULAR | Status: DC | PRN

## 2018-02-07 MED ORDER — ALBUTEROL SULFATE (2.5 MG/3ML) 0.083% IN NEBU: 2.5000 mg | INHALATION_SOLUTION | Freq: Four times a day (QID) | RESPIRATORY_TRACT | Status: DC | PRN

## 2018-02-07 MED ORDER — OXYCODONE HCL 5 MG/5ML PO SOLN: 5.0000 mg | Freq: Once | ORAL | Status: DC | PRN

## 2018-02-07 MED ORDER — SODIUM CHLORIDE 0.9% FLUSH: 3.0000 mL | INTRAVENOUS | Status: DC | PRN

## 2018-02-07 MED ORDER — DEXAMETHASONE SODIUM PHOSPHATE 4 MG/ML IJ SOLN
INTRAMUSCULAR | Status: DC | PRN
  Administered 2018-02-07: 4 mg via INTRAVENOUS

## 2018-02-07 MED ORDER — MEPERIDINE HCL 25 MG/ML IJ SOLN
INTRAMUSCULAR | Status: AC
  Filled 2018-02-07: qty 1

## 2018-02-07 MED ORDER — OXYTOCIN 10 UNIT/ML IJ SOLN
INTRAMUSCULAR | Status: AC
  Filled 2018-02-07: qty 4

## 2018-02-07 MED ORDER — LACTATED RINGERS IV SOLN: INTRAVENOUS | Status: DC

## 2018-02-07 MED ORDER — OXYTOCIN 10 UNIT/ML IJ SOLN
INTRAVENOUS | Status: DC | PRN
  Administered 2018-02-07: 40 [IU] via INTRAVENOUS

## 2018-02-07 MED ORDER — CEFAZOLIN SODIUM-DEXTROSE 2-4 GM/100ML-% IV SOLN
2.0000 g | INTRAVENOUS | Status: AC
  Administered 2018-02-07: 2 g via INTRAVENOUS
  Filled 2018-02-07: qty 100

## 2018-02-07 MED ORDER — FENTANYL CITRATE (PF) 100 MCG/2ML IJ SOLN
INTRAMUSCULAR | Status: DC | PRN
  Administered 2018-02-07: 15 ug via INTRAVENOUS

## 2018-02-07 MED ORDER — MENTHOL 3 MG MT LOZG
1.0000 | LOZENGE | OROMUCOSAL | Status: DC | PRN
  Administered 2018-02-10: 3 mg via ORAL
  Filled 2018-02-07: qty 9

## 2018-02-07 MED ORDER — INFLUENZA VAC SPLIT QUAD 0.5 ML IM SUSY
0.5000 mL | PREFILLED_SYRINGE | INTRAMUSCULAR | Status: AC
  Administered 2018-02-10: 0.5 mL via INTRAMUSCULAR
  Filled 2018-02-07: qty 0.5

## 2018-02-07 MED ORDER — NALBUPHINE HCL 10 MG/ML IJ SOLN: 5.0000 mg | INTRAMUSCULAR | Status: DC | PRN

## 2018-02-07 MED ORDER — ACETAMINOPHEN 325 MG PO TABS: 325.0000 mg | ORAL_TABLET | ORAL | Status: DC | PRN

## 2018-02-07 MED ORDER — LEVOTHYROXINE SODIUM 50 MCG PO TABS: 50.0000 ug | ORAL_TABLET | ORAL | Status: DC

## 2018-02-07 MED ORDER — MORPHINE SULFATE (PF) 0.5 MG/ML IJ SOLN
INTRAMUSCULAR | Status: AC
  Filled 2018-02-07: qty 10

## 2018-02-07 MED ORDER — ONDANSETRON HCL 4 MG/2ML IJ SOLN
INTRAMUSCULAR | Status: DC | PRN
  Administered 2018-02-07: 4 mg via INTRAVENOUS

## 2018-02-07 MED ORDER — NALBUPHINE HCL 10 MG/ML IJ SOLN: 5.0000 mg | Freq: Once | INTRAMUSCULAR | Status: DC | PRN

## 2018-02-07 MED ORDER — LACTATED RINGERS IV SOLN
INTRAVENOUS | Status: DC
  Administered 2018-02-07: 20:00:00 via INTRAVENOUS

## 2018-02-07 MED ORDER — DIBUCAINE 1 % RE OINT: 1.0000 "application " | TOPICAL_OINTMENT | RECTAL | Status: DC | PRN

## 2018-02-07 MED ORDER — DEXAMETHASONE SODIUM PHOSPHATE 4 MG/ML IJ SOLN
INTRAMUSCULAR | Status: AC
  Filled 2018-02-07: qty 1

## 2018-02-07 MED ORDER — SERTRALINE HCL 100 MG PO TABS
100.0000 mg | ORAL_TABLET | Freq: Every day | ORAL | Status: DC
  Administered 2018-02-08 – 2018-02-10 (×3): 100 mg via ORAL
  Filled 2018-02-07 (×4): qty 1

## 2018-02-07 MED ORDER — WITCH HAZEL-GLYCERIN EX PADS: 1.0000 "application " | MEDICATED_PAD | CUTANEOUS | Status: DC | PRN

## 2018-02-07 MED ORDER — NALOXONE HCL 0.4 MG/ML IJ SOLN: 0.4000 mg | INTRAMUSCULAR | Status: DC | PRN

## 2018-02-07 MED ORDER — LACTATED RINGERS IV SOLN
INTRAVENOUS | Status: DC | PRN
  Administered 2018-02-07 (×3): via INTRAVENOUS

## 2018-02-07 MED ORDER — METOCLOPRAMIDE HCL 5 MG/ML IJ SOLN
INTRAMUSCULAR | Status: DC | PRN
  Administered 2018-02-07: 10 mg via INTRAVENOUS

## 2018-02-07 MED ORDER — ALBUMIN HUMAN 5 % IV SOLN
INTRAVENOUS | Status: DC | PRN
  Administered 2018-02-07: 09:00:00 via INTRAVENOUS

## 2018-02-07 MED ORDER — BUPIVACAINE IN DEXTROSE 0.75-8.25 % IT SOLN
INTRATHECAL | Status: DC | PRN
  Administered 2018-02-07: 1.6 mL via INTRATHECAL

## 2018-02-07 MED ORDER — FENTANYL CITRATE (PF) 100 MCG/2ML IJ SOLN
INTRAMUSCULAR | Status: AC
  Filled 2018-02-07: qty 2

## 2018-02-07 MED ORDER — IBUPROFEN 600 MG PO TABS
600.0000 mg | ORAL_TABLET | Freq: Four times a day (QID) | ORAL | Status: DC
  Administered 2018-02-07 – 2018-02-10 (×11): 600 mg via ORAL
  Filled 2018-02-07 (×11): qty 1

## 2018-02-07 MED ORDER — OXYTOCIN 40 UNITS IN LACTATED RINGERS INFUSION - SIMPLE MED: 2.5000 [IU]/h | INTRAVENOUS | Status: AC

## 2018-02-07 MED ORDER — LACTATED RINGERS IV SOLN
INTRAVENOUS | Status: DC | PRN
  Administered 2018-02-07: 08:00:00 via INTRAVENOUS

## 2018-02-07 MED ORDER — SCOPOLAMINE 1 MG/3DAYS TD PT72
1.0000 | MEDICATED_PATCH | Freq: Once | TRANSDERMAL | Status: AC
  Administered 2018-02-07: 1.5 mg via TRANSDERMAL
  Filled 2018-02-07: qty 1

## 2018-02-07 MED ORDER — SIMETHICONE 80 MG PO CHEW
80.0000 mg | CHEWABLE_TABLET | Freq: Three times a day (TID) | ORAL | Status: DC
  Administered 2018-02-08 – 2018-02-10 (×7): 80 mg via ORAL
  Filled 2018-02-07 (×7): qty 1

## 2018-02-07 MED ORDER — ONDANSETRON HCL 4 MG/2ML IJ SOLN: 4.0000 mg | Freq: Once | INTRAMUSCULAR | Status: DC | PRN

## 2018-02-07 MED ORDER — MEPERIDINE HCL 25 MG/ML IJ SOLN
INTRAMUSCULAR | Status: DC | PRN
  Administered 2018-02-07 (×2): 12.5 mg via INTRAVENOUS

## 2018-02-07 MED ORDER — LEVOTHYROXINE SODIUM 50 MCG PO TABS
50.0000 ug | ORAL_TABLET | ORAL | Status: DC
  Administered 2018-02-08 – 2018-02-10 (×3): 50 ug via ORAL
  Filled 2018-02-07 (×4): qty 1

## 2018-02-07 MED ORDER — SODIUM CHLORIDE 0.9 % IR SOLN
Status: DC | PRN
  Administered 2018-02-07: 1000 mL

## 2018-02-07 MED ORDER — TETANUS-DIPHTH-ACELL PERTUSSIS 5-2.5-18.5 LF-MCG/0.5 IM SUSP: 0.5000 mL | Freq: Once | INTRAMUSCULAR | Status: DC

## 2018-02-07 MED ORDER — SODIUM CHLORIDE 0.9 % IR SOLN
Status: DC | PRN
  Administered 2018-02-07: 1

## 2018-02-07 MED ORDER — ACETAMINOPHEN 160 MG/5ML PO SOLN: 325.0000 mg | ORAL | Status: DC | PRN

## 2018-02-07 MED ORDER — MORPHINE SULFATE (PF) 0.5 MG/ML IJ SOLN
INTRAMUSCULAR | Status: DC | PRN
  Administered 2018-02-07: 150 ug via INTRATHECAL

## 2018-02-07 MED ORDER — OXYCODONE HCL 5 MG PO TABS
5.0000 mg | ORAL_TABLET | ORAL | Status: DC | PRN
  Administered 2018-02-07 – 2018-02-10 (×5): 5 mg via ORAL
  Filled 2018-02-07 (×6): qty 1

## 2018-02-07 MED ORDER — FENTANYL CITRATE (PF) 100 MCG/2ML IJ SOLN: 25.0000 ug | INTRAMUSCULAR | Status: DC | PRN

## 2018-02-07 MED ORDER — ONDANSETRON HCL 4 MG/2ML IJ SOLN
INTRAMUSCULAR | Status: AC
  Filled 2018-02-07: qty 2

## 2018-02-07 MED ORDER — KETOROLAC TROMETHAMINE 30 MG/ML IJ SOLN
30.0000 mg | Freq: Four times a day (QID) | INTRAMUSCULAR | Status: AC | PRN
  Administered 2018-02-07: 30 mg via INTRAVENOUS
  Filled 2018-02-07: qty 1

## 2018-02-07 MED ORDER — DIPHENHYDRAMINE HCL 25 MG PO CAPS: 25.0000 mg | ORAL_CAPSULE | Freq: Four times a day (QID) | ORAL | Status: DC | PRN

## 2018-02-07 MED ORDER — PHENYLEPHRINE 8 MG IN D5W 100 ML (0.08MG/ML) PREMIX OPTIME
INJECTION | INTRAVENOUS | Status: DC | PRN
  Administered 2018-02-07: 60 ug/min via INTRAVENOUS

## 2018-02-07 MED ORDER — METOCLOPRAMIDE HCL 5 MG/ML IJ SOLN
INTRAMUSCULAR | Status: AC
  Filled 2018-02-07: qty 2

## 2018-02-07 MED ORDER — ALBUMIN HUMAN 5 % IV SOLN
INTRAVENOUS | Status: AC
  Filled 2018-02-07: qty 250

## 2018-02-07 MED ORDER — PHENYLEPHRINE 40 MCG/ML (10ML) SYRINGE FOR IV PUSH (FOR BLOOD PRESSURE SUPPORT)
PREFILLED_SYRINGE | INTRAVENOUS | Status: AC
  Filled 2018-02-07: qty 20

## 2018-02-07 MED ORDER — DIPHENHYDRAMINE HCL 50 MG/ML IJ SOLN
INTRAMUSCULAR | Status: AC
  Filled 2018-02-07: qty 1

## 2018-02-07 MED ORDER — SENNOSIDES-DOCUSATE SODIUM 8.6-50 MG PO TABS
2.0000 | ORAL_TABLET | ORAL | Status: DC
  Administered 2018-02-07 – 2018-02-09 (×3): 2 via ORAL
  Filled 2018-02-07 (×3): qty 2

## 2018-02-07 MED ORDER — SOD CITRATE-CITRIC ACID 500-334 MG/5ML PO SOLN
30.0000 mL | ORAL | Status: AC
  Administered 2018-02-07: 30 mL via ORAL
  Filled 2018-02-07: qty 15

## 2018-02-07 MED ORDER — DIPHENHYDRAMINE HCL 25 MG PO CAPS
25.0000 mg | ORAL_CAPSULE | ORAL | Status: DC | PRN
  Administered 2018-02-08: 25 mg via ORAL
  Filled 2018-02-07: qty 1

## 2018-02-07 MED ORDER — SIMETHICONE 80 MG PO CHEW
80.0000 mg | CHEWABLE_TABLET | ORAL | Status: DC
  Administered 2018-02-07 – 2018-02-09 (×3): 80 mg via ORAL
  Filled 2018-02-07 (×3): qty 1

## 2018-02-07 MED ORDER — PHENYLEPHRINE HCL 10 MG/ML IJ SOLN
INTRAMUSCULAR | Status: DC | PRN
  Administered 2018-02-07: 40 ug via INTRAVENOUS
  Administered 2018-02-07: 120 ug via INTRAVENOUS
  Administered 2018-02-07: 80 ug via INTRAVENOUS
  Administered 2018-02-07 (×4): 120 ug via INTRAVENOUS
  Administered 2018-02-07: 80 ug via INTRAVENOUS

## 2018-02-07 MED ORDER — OXYCODONE HCL 5 MG PO TABS: 5.0000 mg | ORAL_TABLET | Freq: Once | ORAL | Status: DC | PRN

## 2018-02-07 MED ORDER — ZOLPIDEM TARTRATE 5 MG PO TABS: 5.0000 mg | ORAL_TABLET | Freq: Every evening | ORAL | Status: DC | PRN

## 2018-02-07 MED ORDER — DIPHENHYDRAMINE HCL 50 MG/ML IJ SOLN
12.5000 mg | INTRAMUSCULAR | Status: DC | PRN
  Administered 2018-02-07: 12.5 mg via INTRAVENOUS

## 2018-02-07 MED ORDER — ACETAMINOPHEN 325 MG PO TABS
650.0000 mg | ORAL_TABLET | ORAL | Status: DC | PRN
  Administered 2018-02-07 – 2018-02-09 (×6): 650 mg via ORAL
  Filled 2018-02-07 (×7): qty 2

## 2018-02-07 MED ORDER — LEVOTHYROXINE SODIUM 100 MCG PO TABS: 100.0000 ug | ORAL_TABLET | ORAL | Status: DC

## 2018-02-07 MED ORDER — OXYCODONE HCL 5 MG PO TABS
10.0000 mg | ORAL_TABLET | ORAL | Status: DC | PRN
  Administered 2018-02-08 (×3): 10 mg via ORAL
  Administered 2018-02-09: 5 mg via ORAL
  Administered 2018-02-09: 10 mg via ORAL
  Filled 2018-02-07 (×4): qty 2

## 2018-02-07 MED ORDER — COCONUT OIL OIL
1.0000 "application " | TOPICAL_OIL | Status: DC | PRN
  Administered 2018-02-09: 1 via TOPICAL
  Filled 2018-02-07: qty 120

## 2018-02-07 MED ORDER — ONDANSETRON HCL 4 MG/2ML IJ SOLN: 4.0000 mg | Freq: Three times a day (TID) | INTRAMUSCULAR | Status: DC | PRN

## 2018-02-07 SURGICAL SUPPLY — 42 items
BENZOIN TINCTURE PRP APPL 2/3 (GAUZE/BANDAGES/DRESSINGS) ×2 IMPLANT
CHLORAPREP W/TINT 26ML (MISCELLANEOUS) ×2 IMPLANT
CLAMP CORD UMBIL (MISCELLANEOUS) IMPLANT
CLOSURE STERI-STRIP 1/4X4 (GAUZE/BANDAGES/DRESSINGS) ×2 IMPLANT
CLOTH BEACON ORANGE TIMEOUT ST (SAFETY) ×2 IMPLANT
DERMABOND ADVANCED (GAUZE/BANDAGES/DRESSINGS)
DERMABOND ADVANCED .7 DNX12 (GAUZE/BANDAGES/DRESSINGS) IMPLANT
DRSG OPSITE POSTOP 4X10 (GAUZE/BANDAGES/DRESSINGS) ×2 IMPLANT
ELECT REM PT RETURN 9FT ADLT (ELECTROSURGICAL) ×2
ELECTRODE REM PT RTRN 9FT ADLT (ELECTROSURGICAL) ×1 IMPLANT
EXTRACTOR VACUUM M CUP 4 TUBE (SUCTIONS) IMPLANT
GLOVE BIO SURGEON STRL SZ7.5 (GLOVE) ×2 IMPLANT
GLOVE BIOGEL PI IND STRL 7.0 (GLOVE) ×1 IMPLANT
GLOVE BIOGEL PI INDICATOR 7.0 (GLOVE) ×1
GOWN STRL REUS W/TWL LRG LVL3 (GOWN DISPOSABLE) ×4 IMPLANT
HEMOSTAT SURGICEL 2X3 (HEMOSTASIS) ×2 IMPLANT
KIT ABG SYR 3ML LUER SLIP (SYRINGE) ×2 IMPLANT
NEEDLE HYPO 25X5/8 SAFETYGLIDE (NEEDLE) ×2 IMPLANT
NS IRRIG 1000ML POUR BTL (IV SOLUTION) ×2 IMPLANT
PACK C SECTION WH (CUSTOM PROCEDURE TRAY) ×2 IMPLANT
PAD ABD 8X7 1/2 STERILE (GAUZE/BANDAGES/DRESSINGS) ×4 IMPLANT
PAD OB MATERNITY 4.3X12.25 (PERSONAL CARE ITEMS) ×2 IMPLANT
PENCIL SMOKE EVAC W/HOLSTER (ELECTROSURGICAL) ×2 IMPLANT
SPONGE GAUZE 4X4 12PLY (GAUZE/BANDAGES/DRESSINGS) ×4 IMPLANT
SPONGE LAP 18X18 RF (DISPOSABLE) ×8 IMPLANT
SPONGE LAP 18X18 X RAY DECT (DISPOSABLE) ×4 IMPLANT
SPONGE SURGIFOAM ABS GEL 12-7 (HEMOSTASIS) ×4 IMPLANT
STRIP CLOSURE SKIN 1/2X4 (GAUZE/BANDAGES/DRESSINGS) IMPLANT
SUT CHROMIC 2 0 CT 1 (SUTURE) ×2 IMPLANT
SUT CHROMIC 2 0 SH (SUTURE) ×6 IMPLANT
SUT MNCRL 0 MO-4 VIOLET 18 CR (SUTURE) ×1 IMPLANT
SUT MNCRL 0 VIOLET CTX 36 (SUTURE) ×8 IMPLANT
SUT MONOCRYL 0 CTX 36 (SUTURE) ×8
SUT MONOCRYL 0 MO 4 18  CR/8 (SUTURE) ×1
SUT PDS AB 0 CTX 60 (SUTURE) ×4 IMPLANT
SUT PLAIN 0 NONE (SUTURE) IMPLANT
SUT PLAIN 2 0 (SUTURE)
SUT PLAIN 2 0 XLH (SUTURE) ×2 IMPLANT
SUT PLAIN ABS 2-0 CT1 27XMFL (SUTURE) IMPLANT
SUT VIC AB 4-0 KS 27 (SUTURE) ×2 IMPLANT
TOWEL OR 17X24 6PK STRL BLUE (TOWEL DISPOSABLE) ×2 IMPLANT
TRAY FOLEY W/BAG SLVR 14FR LF (SET/KITS/TRAYS/PACK) ×2 IMPLANT

## 2018-02-07 NOTE — Anesthesia Postprocedure Evaluation (Signed)
Anesthesia Post Note  Patient: [name]  Procedure(s) Performed: REPEAT CESAREAN SECTION (N/A )     Patient location during evaluation: PACU Anesthesia Type: Spinal Level of consciousness: oriented and awake and alert Pain management: pain level controlled Vital Signs Assessment: post-procedure vital signs reviewed and stable Respiratory status: spontaneous breathing, respiratory function stable and patient connected to nasal cannula oxygen Cardiovascular status: blood pressure returned to baseline and stable Postop Assessment: no headache, no backache and no apparent nausea or vomiting Anesthetic complications: no    Last Vitals:  Vitals:   02/07/18 0945 02/07/18 1000  BP: 108/69 120/68  Pulse: 79 67  Resp: 16 15  Temp:  37.3 C  SpO2: 100% 98%    Last Pain:  Vitals:   02/07/18 1000  TempSrc: Oral  PainSc: 0-No pain   Pain Goal: Patients Stated Pain Goal: 4 (02/07/18 0714)  LLE Motor Response: Purposeful movement (02/07/18 1000) LLE Sensation: Tingling (02/07/18 1000) RLE Motor Response: Purposeful movement (02/07/18 1000) RLE Sensation: Tingling (02/07/18 1000)      Tahja Liao

## 2018-02-07 NOTE — Transfer of Care (Signed)
Immediate Anesthesia Transfer of Care Note  Patient: [name]  Procedure(s) Performed: REPEAT CESAREAN SECTION (N/A )  Patient Location: PACU  Anesthesia Type:Spinal  Level of Consciousness: awake, alert  and oriented  Airway & Oxygen Therapy: Patient Spontanous Breathing  Post-op Assessment: Report given to RN and Post -op Vital signs reviewed and stable  Post vital signs: Reviewed and stable  Last Vitals:  Vitals Value Taken Time  BP 106/72 02/07/2018  9:30 AM  Temp    Pulse 72 02/07/2018  9:31 AM  Resp 17 02/07/2018  9:31 AM  SpO2 100 % 02/07/2018  9:31 AM  Vitals shown include unvalidated device data.  Last Pain:  Vitals:   02/07/18 0639  TempSrc:   PainSc: 0-No pain      Patients Stated Pain Goal: 4 (49/70/26 3785)  Complications: No apparent anesthesia complications

## 2018-02-07 NOTE — Progress Notes (Signed)
Dr. Corinna Capra was notified of Dr. Gaetano Net stating that the patient could not have NSAIDs in PACU because of blood loss.  He was informed of her blood loss of 900 and platelets 152, he stated it would be fine for her to have torodol or motrin to help with her pain.  Leverne Amrhein, [place]

## 2018-02-07 NOTE — Brief Op Note (Signed)
02/07/2018  9:02 AM  PATIENT:  [name]  47 y.o. female  PRE-OPERATIVE DIAGNOSIS:  previous X 1, chronic hypertrension  POST-OPERATIVE DIAGNOSIS:  previous x 1, chronic hypertension; right hydrosalpinx bleeding  PROCEDURE:  Procedure(s) with comments: REPEAT CESAREAN SECTION (N/A) - Repeat edc 02/20/18 allergy to Sherril Cong RNFA Right salpingectomy   SURGEON:  Surgeon(s) and Role:    * Everlene Farrier, MD - Primary  PHYSICIAN ASSISTANT:   ASSISTANTS:  Terrill Mohr RNFA ANESTHESIA:   spinal  EBL:  857 mL   BLOOD ADMINISTERED:none  DRAINS: Urinary Catheter (Foley)   LOCAL MEDICATIONS USED:  NONE  SPECIMEN:  Source of Specimen:  placenta, right fallopian tube  DISPOSITION OF SPECIMEN:  PATHOLOGY  COUNTS:  YES  TOURNIQUET:  * No tourniquets in log *  DICTATION: .Other Dictation: Dictation Number [id number]  PLAN OF CARE: Admit to inpatient   PATIENT DISPOSITION:  PACU - hemodynamically stable.   Delay start of Pharmacological VTE agent (>24hrs) due to surgical blood loss or risk of bleeding: not applicable

## 2018-02-07 NOTE — Anesthesia Procedure Notes (Signed)
Spinal  Start time: 02/07/2018 7:35 AM End time: 02/07/2018 7:38 AM Staffing Anesthesiologist: Janeece Riggers, MD Other anesthesia staff: Darliss Cheney, RN Performed: other anesthesia staff  Preanesthetic Checklist Completed: patient identified, site marked, surgical consent, pre-op evaluation, timeout performed, IV checked, risks and benefits discussed and monitors and equipment checked Spinal Block Patient position: sitting Prep: DuraPrep Patient monitoring: heart rate, cardiac monitor, continuous pulse ox and blood pressure Approach: midline Location: L3-4 Injection technique: single-shot Needle Needle type: Pencan  Needle gauge: 24 G Needle length: 10 cm Needle insertion depth: 9 cm Assessment Sensory level: T4

## 2018-02-07 NOTE — H&P (Signed)
[name] is a 47 y.o. female presenting for repeat cesarean section. Pregnancy complicated by IVF donor egg, AMA with normal Panorama,hypothyroidism, depression, previous c/s desires repeat, and gestational hypertension with normal labs. OB History    Gravida  4   Para  3   Term  3   Preterm      AB      Living  3     SAB      TAB      Ectopic      Multiple      Live Births  3          Past Medical History:  Diagnosis Date  . AMA (advanced maternal age) multigravida 69+   . Anxiety   . Cancer ([place])    skin  . H/O varicella   . Migraine   . Newborn product of in vitro fertilization (IVF) pregnancy   . PONV (postoperative nausea and vomiting)    Past Surgical History:  Procedure Laterality Date  . CESAREAN SECTION  03/21/2012   Procedure: CESAREAN SECTION;  Surgeon: Allena Katz, MD;  Location: [facility];  Service: Obstetrics;  Laterality: N/A;  . GYNECOLOGIC CRYOSURGERY    . MELANOMA EXCISION  2005   L arm  . WISDOM TOOTH EXTRACTION  1994   Family History: family history includes COPD in her maternal grandmother and paternal grandmother; Cancer in her maternal grandmother; Depression in her mother; Diabetes in her father; Heart attack in her paternal grandfather; Hypertension in her father. Social History:  reports that she has never smoked. She has never used smokeless tobacco. She reports that she does not drink alcohol or use drugs.     Maternal Diabetes: No Genetic Screening: Normal Maternal Ultrasounds/Referrals: Normal Fetal Ultrasounds or other Referrals:  None Maternal Substance Abuse:  No Significant Maternal Medications:  None Significant Maternal Lab Results:  None Other Comments:  None  Review of Systems  Eyes: Negative for blurred vision.  Gastrointestinal: Negative for abdominal pain.  Neurological: Negative for headaches.   Maternal Medical History:  Fetal activity: Perceived fetal activity is normal.        Blood  pressure 137/80, pulse 88, temperature 98.1 F (36.7 C), temperature source Oral, resp. rate 18, height 5\' 4"  (1.626 m), weight 110.3 kg, last menstrual period 05/14/2017, SpO2 100 %, currently breastfeeding. Maternal Exam:  Abdomen: Patient reports no abdominal tenderness.   Fetal Exam Fetal State Assessment: Category I - tracings are normal.     Physical Exam  Cardiovascular: Normal rate and regular rhythm.  Respiratory: Effort normal and breath sounds normal.  GI: Soft. There is no tenderness.  Neurological: She has normal reflexes.    Prenatal labs: ABO, Rh: --/--/O POS (09/30 1010) Antibody: NEG (09/30 1010) Rubella: Immune (03/13 0000) RPR: Non Reactive (09/30 1010)  HBsAg: Negative (03/13 0000)  HIV: Non-reactive (03/13 0000)  GBS:     Assessment/Plan: 47 yo G4P3 @ 30 1/7 weeks with gestational hypertension for repeat cesarean section. Risks reviewed with patient including but not limited to infection, organ damage, bleeding/transfusion-HIV/Hep, DVT/PE, pneumonia, return to OR. Patient states she understands and agrees   Shon Millet II 02/07/2018, 7:22 AM

## 2018-02-07 NOTE — Op Note (Signed)
NAME: [name], [name]:[id number] ACCOUNT [id number] DATE OF BIRTH:12/01/1970 FACILITY: [facility] LOCATION: [place], [place]  OPERATIVE REPORT  DATE OF PROCEDURE:  02/07/2018  PREOPERATIVE DIAGNOSES: 1.  Intrauterine pregnancy at 38-1/7 weeks. 2.  Gestational hypertension. 3.  Previous cesarean section, desires repeat.  POSTOPERATIVE DIAGNOSES: 1.  Intrauterine pregnancy 38-1/7 weeks. 2.  Gestational hypertension. 3.  Previous cesarean section, desires repeat. 4.  Bleeding right hydrosalpinx.  PROCEDURE:  Repeat low transverse cesarean section with a T extension and right salpingectomy.  SURGEON:  Benjiman Core, MD  ASSISTANT:  Claretta Fraise, RNFA   ESTIMATED BLOOD LOSS:  857 mL.  SPECIMENS:  Placenta and right fallopian tube to pathology.  FINDINGS:  Viable female infant.  Apgars to record, pH birth weight pending.  INDICATIONS AND CONSENT:  This patient is a 47 year old G4 P3 with previous cesarean section and gestational hypertension.  Labs have been normal.  Details are dictated in history and physical.  She desires repeat cesarean section.  Baby was also noted  to be in the transverse position.  Surgery and potential risks and complications were discussed preoperatively including but not limited to infection, organ damage, bleeding requiring transfusion of blood products with HIV and hepatitis acquisition, DVT,  PE, pneumonia.  Return to the operating room.  The patient states she understands and agrees, and consent was signed on the chart.  DESCRIPTION OF PROCEDURE:  The patient was taken to the operating room where she was identified.  Spinal anesthetic was placed per anesthesiology, and she was placed in the dorsal supine position with a 15-degree left lateral wedge.  She was prepped  vaginally with Betadine.  A Foley catheter was placed and prepped abdominally with ChloraPrep.  After a 3-minute drying time, she was draped  in a sterile fashion.  Timeout was undertaken.  After testing for adequate spinal anesthesia, the skin was  entered through the previous Pfannenstiel scar and dissection was carried out in layers.  Peritoneum was entered and extended superiorly and inferiorly.  There was a very narrow lower uterine segment and some prominently dilated veins in the adnexa.  The  bladder flap was advanced.  A low transverse incision was made, and the uterine cavity was entered bluntly with a hemostat.  The incision was extended with the fingers.  Artificial rupture of membranes was carried out for clear fluid.  Exploration  revealed the baby to be in transverse position, face down, with the vertex on the right side.  The feet were controlled and brought down to the incision, but the lower part of the uterine fundus contracted, and it was too narrow to allow delivery of the  baby.  Therefore, the uterine incision was T'd in the midline with bandage scissors with great care being taken to avoid the baby.  This allowed the feet and breech to be delivered, followed by the vertex without difficulty.  The cord was clamped and  cut, and the baby was handed to the waiting pediatrics team.  Placenta was then manually delivered and sent to pathology.  Uterus was then exteriorized.  A 4 cm subserosal fibroid was noted on the right upper posterior fundus.  The T extension was closed  in 3 running locking layers with 0 Monocryl suture. The low transverse incision was closed with 2 running locking imbricating layers of 0 Monocryl suture.  Numerous 2-0 chromic sutures were used to aid in hemostasis.  Inspection revealed probable  hydrosalpinges and dilated vessels bilaterally on  the tubes and normal-appearing ovaries.  The uterus was returned to the abdomen.  Again, numerous bleeders at the peritoneal edges and then closure were controlled with chromic suture and some cautery.   There was continued bleeding from the right side.  Inspection  revealed a small defect in the venous cascade below the distal right fallopian tube.  This was clamped with Kellys.  A 0 Monocryl was used as a free tie below both of the clamps.  The pedicle  was then taken down sharply.  An additional 0 Monocryl was used in a pursestring fashion.  Careful inspection revealed the bleeding to be well controlled.  Copious lavage was then carried out.  Eventually control of the bleeders on the peritoneal edges  on the closure was obtained.  Reinspection of the right side revealed continued hemostasis as well.  Gelfoam was placed in 2 separate pieces along the low transverse incision.  The anterior peritoneum was then closed in a running fashion with 0 Monocryl.   A piece of Gelfoam was also placed over the vertical extension of the incision, and the peritoneum was then completely closed which was also used to reapproximate the pyramidalis muscle in the midline.  The anterior rectus fascia was closed in a  running fashion with 0 looped PDS.  This was done starting at each angle and meeting in the middle.  Subcutaneous layer was closed with interrupted plain, and the skin was closed in a subcuticular fashion with 4-0 Vicryl on a Keith needle.  Benzoin,  Steri-Strips, honeycomb and a pressure dressing were applied.  All counts were correct.  The patient was transferred to the PACU in stable condition.  LN/NUANCE  D:02/07/2018 T:02/07/2018 JOB:[id number]

## 2018-02-07 NOTE — Lactation Note (Signed)
This note was copied from a baby's chart. Lactation Consultation Note  Patient Name: [name] [id number] Date: 02/07/2018 Reason for consult: Initial assessment;Early term 62-38.6wks  P4 mother whose infant is now 34 hours old.  Mother breast fed her other 3 children for a very short time with each one (47 year old was fed about 95 months, 47 year old was fed about 3-4 months and her 47 year old was fed for 1 month).  Mother had IVF with this baby.  Baby was sleeping in bassinet when I arrived.  Encouraged mother to feed 8-12 times/24 hours or sooner if baby showed feeding cues.  Reviewed cues.  She will continue STS, hand expression before and after feedings and will call for latch assistance as needed.  Colostrum container provided for any EBM she may obtain with hand expression.  Reviewed many newborn basics and breast feeding questions were answered.  Mother is planning on calling me back in for the next latch assistance.  RN in room and updated.  Mom made aware of O/P services, breastfeeding support groups, community resources, and our phone # for post-discharge questions. Father present and supportive.   Maternal Data Formula Feeding for Exclusion: No Has patient been taught Hand Expression?: Yes Does the patient have breastfeeding experience prior to this delivery?: Yes  Feeding Feeding Type: Breast Fed Length of feed: 0 min  LATCH Score                   Interventions    Lactation Tools Discussed/Used WIC Program: No   Consult Status Consult Status: Follow-up Date: 02/08/18 Follow-up type: In-patient    Shayda Kalka R Anella Nakata 02/07/2018, 5:40 PM

## 2018-02-08 LAB — CBC
HEMATOCRIT: 22.2 % — AB (ref 36.0–46.0)
Hemoglobin: 7.2 g/dL — ABNORMAL LOW (ref 12.0–15.0)
MCH: 28.2 pg (ref 26.0–34.0)
MCHC: 32.4 g/dL (ref 30.0–36.0)
MCV: 87.1 fL (ref 78.0–100.0)
Platelets: 121 10*3/uL — ABNORMAL LOW (ref 150–400)
RBC: 2.55 MIL/uL — ABNORMAL LOW (ref 3.87–5.11)
RDW: 15.1 % (ref 11.5–15.5)
WBC: 10.2 10*3/uL (ref 4.0–10.5)

## 2018-02-08 NOTE — Plan of Care (Signed)
  Problem: Activity: Goal: Risk for activity intolerance will decrease Outcome: Completed/Met  Patient ambulating well in room, has been able to ambulate unassisted and has been encouraged to walk in the hallway this afternoon.  Problem: Elimination: Goal: Will not experience complications related to urinary retention Outcome: Completed/Met  Patient has been able to void adequately and multiple times since catheter removal.  Is not experiencing any problems related to voiding.

## 2018-02-08 NOTE — Anesthesia Postprocedure Evaluation (Signed)
Anesthesia Post Note  Patient: [name]  Procedure(s) Performed: REPEAT CESAREAN SECTION (N/A )     Patient location during evaluation: Mother Baby Anesthesia Type: Spinal Level of consciousness: awake, awake and alert and oriented Pain management: pain level controlled Vital Signs Assessment: post-procedure vital signs reviewed and stable Respiratory status: spontaneous breathing, nonlabored ventilation and respiratory function stable Cardiovascular status: stable Postop Assessment: no headache, no backache, adequate PO intake, able to ambulate and patient able to bend at knees Anesthetic complications: no    Last Vitals:  Vitals:   02/08/18 0210 02/08/18 0545  BP: (!) 111/57 127/77  Pulse: 71 70  Resp: 16 18  Temp: 36.8 C 36.8 C  SpO2:  99%    Last Pain:  Vitals:   02/08/18 0549  TempSrc:   PainSc: 5    Pain Goal: Patients Stated Pain Goal: 3 (02/08/18 0549)               Marcy Siren

## 2018-02-08 NOTE — Progress Notes (Signed)
Patient doing well No complaints BP 127/77 (BP Location: Left Arm)   Pulse 70   Temp 98.3 F (36.8 C) (Oral)   Resp 18   Ht 5\' 4"  (1.626 m)   Wt 110.3 kg   LMP 05/14/2017   SpO2 99%   BMI 41.73 kg/m  Abdomen  Dressing removed - saturated - bright red - I removed the dressing completely and steri strips removed No drainage at all with pressure I cleaned skin and replaced steri strips, honeycomb and pressure dressing  Results for orders placed or performed during the hospital encounter of 02/07/18 (from the past 24 hour(s))  CBC     Status: Abnormal   Collection Time: 02/07/18  3:11 PM  Result Value Ref Range   WBC 14.9 (H) 4.0 - 10.5 K/uL   RBC 3.12 (L) 3.87 - 5.11 MIL/uL   Hemoglobin 9.1 (L) 12.0 - 15.0 g/dL   HCT 27.0 (L) 36.0 - 46.0 %   MCV 86.5 78.0 - 100.0 fL   MCH 29.2 26.0 - 34.0 pg   MCHC 33.7 30.0 - 36.0 g/dL   RDW 14.8 11.5 - 15.5 %   Platelets 132 (L) 150 - 400 K/uL  CBC     Status: Abnormal   Collection Time: 02/08/18  5:19 AM  Result Value Ref Range   WBC 10.2 4.0 - 10.5 K/uL   RBC 2.55 (L) 3.87 - 5.11 MIL/uL   Hemoglobin 7.2 (L) 12.0 - 15.0 g/dL   HCT 22.2 (L) 36.0 - 46.0 %   MCV 87.1 78.0 - 100.0 fL   MCH 28.2 26.0 - 34.0 pg   MCHC 32.4 30.0 - 36.0 g/dL   RDW 15.1 11.5 - 15.5 %   Platelets 121 (L) 150 - 400 K/uL   Post op Day # 1 Doing well Monitor dressing Remove new pressure dressing tomorrow

## 2018-02-08 NOTE — Lactation Note (Signed)
This note was copied from a baby's chart. Lactation Consultation Note  Patient Name: [name] [id number] Date: 02/08/2018 Reason for consult: Follow-up assessment;Mother's request;Early term 37-38.6wks P3. 64 hour female infant, ETI mom requested help. Mom concerns she doesn't have enough colostrum for infant. LC ask mom hand express and mom expressed 5 ml easily on spoon that was given to infant. Mom was reassured she has enough breast milk for infant. LC notice blister on mom's left breast and help assist Mom in latching infant to breast in cross-cradle hold. Infant latched with wide gape and lower jaw extended. LC discussed with mom if she feels pain to unlatch infant and try again to prevent nipple trauma and sore nipples from occurring. Mom stated she understood. LC gave mom comfort gels and explained how to use. Mom informed LC infant doesn't latch as well to right breast and mostly feeding on left. LC notice right breast is short shafted and gave mom breast shells to wear  Only for right breast and to help elongate nipple out more.  Mom will call nurse or [place] if she has any questions or concerns or need further assistance with latching infant to breast.  Maternal Data Formula Feeding for Exclusion: No  Feeding Feeding Type: Breast Fed Length of feed: 30 min  LATCH Score Latch: Grasps breast easily, tongue down, lips flanged, rhythmical sucking.  Audible Swallowing: Spontaneous and intermittent  Type of Nipple: Everted at rest and after stimulation(right breast is short shafted )  Comfort (Breast/Nipple): Filling, red/small blisters or bruises, mild/mod discomfort  Hold (Positioning): Assistance needed to correctly position infant at breast and maintain latch.  LATCH Score: 8  Interventions Interventions: Assisted with latch;Expressed milk  Lactation Tools Discussed/Used     Consult Status Consult Status: Follow-up Date: 02/08/18    Vicente Serene 02/08/2018,  1:35 AM

## 2018-02-08 NOTE — Addendum Note (Signed)
Addendum  created 02/08/18 3716 by Elenore Paddy, CRNA   Sign clinical note

## 2018-02-08 NOTE — Progress Notes (Signed)
MOB was referred for history of depression/anxiety. * Referral screened out by Clinical Social Worker because none of the following criteria appear to apply: ~ History of anxiety/depression during this pregnancy, or of post-partum depression following prior delivery. ~ Diagnosis of anxiety and/or depression within last 3 years OR * MOB's symptoms currently being treated with medication and/or therapy. Please contact the Clinical Social Worker if needs arise, by MOB request, or if MOB scores greater than 9/yes to question 10 on Edinburgh Postpartum Depression Screen.  Drayce Tawil, LCSW Clinical Social Worker  [facility]  [phone]  

## 2018-02-08 NOTE — Lactation Note (Signed)
This note was copied from a baby's chart. Lactation Consultation Note  Patient Name: [name] [id number] Date: 02/08/2018 Reason for consult: Follow-up assessment;Early term 49-38.6wks RN assisted mom latching baby to right breast using football hold.  Baby is latched well with good depth and mom comfortable with feeding.  Baby is sleepy and needed stimulation to suck.  Recommended good breast massage during feeding.  Encouraged to call for assist prn.  Maternal Data    Feeding Feeding Type: Breast Fed  LATCH Score Latch: Repeated attempts needed to sustain latch, nipple held in mouth throughout feeding, stimulation needed to elicit sucking reflex.  Audible Swallowing: A few with stimulation  Type of Nipple: Everted at rest and after stimulation  Comfort (Breast/Nipple): Filling, red/small blisters or bruises, mild/mod discomfort  Hold (Positioning): Assistance needed to correctly position infant at breast and maintain latch.  LATCH Score: 6  Interventions    Lactation Tools Discussed/Used     Consult Status Consult Status: Follow-up Date: 02/09/18 Follow-up type: In-patient    Ave Filter 02/08/2018, 2:41 PM

## 2018-02-09 MED ORDER — BISACODYL 10 MG RE SUPP
10.0000 mg | Freq: Once | RECTAL | Status: AC
  Administered 2018-02-09: 10 mg via RECTAL
  Filled 2018-02-09: qty 1

## 2018-02-09 NOTE — Lactation Note (Signed)
This note was copied from a baby's chart. Lactation Consultation Note  Patient Name: [name] [id number] Date: 02/09/2018   P4, IVF.  Mother's states she had challenges with her milk supply. Reviewed volume guidelines for supplementation for breastfed baby. Mother recently gave baby 91ml + of formula and baby spit up large amount while LC in room. Mom encouraged to feed baby 8-12 times/24 hours and with feeding cues breastfeed on both breasts before offering formula. Offered to set up DEBP with mother but she states she will start pumping when she gets home.      Maternal Data    Feeding    LATCH Score                   Interventions    Lactation Tools Discussed/Used     Consult Status      [name] 02/09/2018, 12:18 PM

## 2018-02-09 NOTE — Progress Notes (Signed)
Subjective: Postpartum Day 2: Cesarean Delivery Patient reports tolerating PO, + flatus and no problems voiding.   No BM yet, some flatus Ambulating well, no N/V, some flatus, tolerating regular diet Objective: Vital signs in last 24 hours: Temp:  [97.7 F (36.5 C)-98.1 F (36.7 C)] 98.1 F (36.7 C) (10/03 0507) Pulse Rate:  [71-84] 71 (10/03 0507) Resp:  [18-20] 18 (10/03 0507) BP: (117-157)/(80-88) 117/88 (10/03 0507) SpO2:  [100 %] 100 % (10/03 0507)  Physical Exam:  General: alert, cooperative and no distress Lochia: appropriate Uterine Fundus: firm Incision: healing well DVT Evaluation: No evidence of DVT seen on physical exam. Dressing C&D Abdomen soft, mild distention, +BS Recent Labs    02/07/18 1511 02/08/18 0519  HGB 9.1* 7.2*  HCT 27.0* 22.2*    Assessment/Plan: Status post Cesarean section. Doing well postoperatively.  Continue current care Dulcolax suppository Encourage ambulation.  Shon Millet II 02/09/2018, 7:45 AM

## 2018-02-10 MED ORDER — IBUPROFEN 600 MG PO TABS: 600.0000 mg | ORAL_TABLET | Freq: Four times a day (QID) | ORAL | 0 refills | Status: AC | PRN

## 2018-02-10 MED ORDER — OXYCODONE-ACETAMINOPHEN 5-325 MG PO TABS: 1.0000 | ORAL_TABLET | ORAL | 0 refills | Status: DC | PRN

## 2018-02-10 NOTE — Discharge Summary (Signed)
Obstetric Discharge Summary Reason for Admission: cesarean section Prenatal Procedures: ultrasound Intrapartum Procedures: cesarean: low cervical, transverse, salpingectomy Postpartum Procedures: none Complications-Operative and Postpartum: bleeding hydrosalpinx -  Hemoglobin  Date Value Ref Range Status  02/08/2018 7.2 (L) 12.0 - 15.0 g/dL Final    Comment:    DELTA CHECK NOTED REPEATED TO VERIFY    HCT  Date Value Ref Range Status  02/08/2018 22.2 (L) 36.0 - 46.0 % Final    Physical Exam:  General: alert and cooperative Lochia: appropriate Uterine Fundus: firm Incision: healing well, no significant drainage DVT Evaluation: No evidence of DVT seen on physical exam.  Discharge Diagnoses: Term Pregnancy-delivered  Discharge Information: Date: 02/10/2018 Activity: pelvic rest Diet: routine Medications: PNV, ibuprofen, percocet Condition: stable Instructions: refer to practice specific booklet Discharge to: home [place] Schedule an appointment as soon as possible for a visit in 1 week(s).   Contact information: [address] [place] [address] [phone]           Newborn Data: Live born female  Birth Weight: 6 lb 12.8 oz (3085 g) APGAR: 7, 9  Newborn Delivery   Birth date/time:  [birth year]/2018 08:00:00 Delivery type:  C-Section, Low Transverse Trial of labor:  No C-section categorization:  Repeat     Home with mother.  Marylynn Pearson 02/10/2018, 9:08 AM

## 2018-03-03 DIAGNOSIS — K080 Exfoliation of teeth due to systemic causes: Secondary | ICD-10-CM | POA: Diagnosis not present

## 2018-03-15 DIAGNOSIS — A084 Viral intestinal infection, unspecified: Secondary | ICD-10-CM | POA: Diagnosis not present

## 2018-03-16 ENCOUNTER — Other Ambulatory Visit: Payer: Self-pay

## 2018-03-16 ENCOUNTER — Emergency Department (HOSPITAL_BASED_OUTPATIENT_CLINIC_OR_DEPARTMENT_OTHER): Payer: Federal, State, Local not specified - PPO

## 2018-03-16 ENCOUNTER — Inpatient Hospital Stay (HOSPITAL_BASED_OUTPATIENT_CLINIC_OR_DEPARTMENT_OTHER)
Admission: EM | Admit: 2018-03-16 | Discharge: 2018-03-20 | DRG: 394 | Disposition: A | Discharge status: Home / Self Care | Payer: Federal, State, Local not specified - PPO | Attending: Surgery | Admitting: Surgery

## 2018-03-16 ENCOUNTER — Encounter (HOSPITAL_BASED_OUTPATIENT_CLINIC_OR_DEPARTMENT_OTHER): Payer: Self-pay

## 2018-03-16 DIAGNOSIS — I10 Essential (primary) hypertension: Secondary | ICD-10-CM | POA: Diagnosis not present

## 2018-03-16 DIAGNOSIS — Z0189 Encounter for other specified special examinations: Secondary | ICD-10-CM

## 2018-03-16 DIAGNOSIS — Z85820 Personal history of malignant melanoma of skin: Secondary | ICD-10-CM | POA: Diagnosis not present

## 2018-03-16 DIAGNOSIS — R112 Nausea with vomiting, unspecified: Secondary | ICD-10-CM | POA: Diagnosis not present

## 2018-03-16 DIAGNOSIS — K430 Incisional hernia with obstruction, without gangrene: Principal | ICD-10-CM | POA: Diagnosis present

## 2018-03-16 DIAGNOSIS — Z79890 Hormone replacement therapy (postmenopausal): Secondary | ICD-10-CM

## 2018-03-16 DIAGNOSIS — Z8632 Personal history of gestational diabetes: Secondary | ICD-10-CM | POA: Diagnosis not present

## 2018-03-16 DIAGNOSIS — Z818 Family history of other mental and behavioral disorders: Secondary | ICD-10-CM | POA: Diagnosis not present

## 2018-03-16 DIAGNOSIS — Z809 Family history of malignant neoplasm, unspecified: Secondary | ICD-10-CM | POA: Diagnosis not present

## 2018-03-16 DIAGNOSIS — N390 Urinary tract infection, site not specified: Secondary | ICD-10-CM

## 2018-03-16 DIAGNOSIS — Z833 Family history of diabetes mellitus: Secondary | ICD-10-CM

## 2018-03-16 DIAGNOSIS — F419 Anxiety disorder, unspecified: Secondary | ICD-10-CM | POA: Diagnosis not present

## 2018-03-16 DIAGNOSIS — Z881 Allergy status to other antibiotic agents status: Secondary | ICD-10-CM | POA: Diagnosis not present

## 2018-03-16 DIAGNOSIS — R1110 Vomiting, unspecified: Secondary | ICD-10-CM | POA: Diagnosis not present

## 2018-03-16 DIAGNOSIS — Z825 Family history of asthma and other chronic lower respiratory diseases: Secondary | ICD-10-CM | POA: Diagnosis not present

## 2018-03-16 DIAGNOSIS — O119 Pre-existing hypertension with pre-eclampsia, unspecified trimester: Secondary | ICD-10-CM | POA: Diagnosis present

## 2018-03-16 DIAGNOSIS — Z98891 History of uterine scar from previous surgery: Secondary | ICD-10-CM

## 2018-03-16 DIAGNOSIS — G43909 Migraine, unspecified, not intractable, without status migrainosus: Secondary | ICD-10-CM | POA: Diagnosis not present

## 2018-03-16 DIAGNOSIS — K56609 Unspecified intestinal obstruction, unspecified as to partial versus complete obstruction: Secondary | ICD-10-CM

## 2018-03-16 DIAGNOSIS — K469 Unspecified abdominal hernia without obstruction or gangrene: Secondary | ICD-10-CM | POA: Diagnosis not present

## 2018-03-16 DIAGNOSIS — Z888 Allergy status to other drugs, medicaments and biological substances status: Secondary | ICD-10-CM

## 2018-03-16 DIAGNOSIS — E039 Hypothyroidism, unspecified: Secondary | ICD-10-CM | POA: Diagnosis not present

## 2018-03-16 DIAGNOSIS — Z8249 Family history of ischemic heart disease and other diseases of the circulatory system: Secondary | ICD-10-CM

## 2018-03-16 DIAGNOSIS — E669 Obesity, unspecified: Secondary | ICD-10-CM

## 2018-03-16 DIAGNOSIS — Z4682 Encounter for fitting and adjustment of non-vascular catheter: Secondary | ICD-10-CM | POA: Diagnosis not present

## 2018-03-16 DIAGNOSIS — K439 Ventral hernia without obstruction or gangrene: Secondary | ICD-10-CM

## 2018-03-16 LAB — URINALYSIS, ROUTINE W REFLEX MICROSCOPIC
BILIRUBIN URINE: NEGATIVE
Glucose, UA: NEGATIVE mg/dL
Hgb urine dipstick: NEGATIVE
KETONES UR: 15 mg/dL — AB
NITRITE: POSITIVE — AB
Protein, ur: NEGATIVE mg/dL
Specific Gravity, Urine: >1.03 — ABNORMAL HIGH (ref 1.005–1.030)
pH: 6 (ref 5.0–8.0)

## 2018-03-16 LAB — URINALYSIS, MICROSCOPIC (REFLEX)

## 2018-03-16 LAB — PREGNANCY, URINE: Preg Test, Ur: NEGATIVE

## 2018-03-16 MED ORDER — SODIUM CHLORIDE 0.9 % IV SOLN
1.0000 g | Freq: Once | INTRAVENOUS | Status: AC
  Administered 2018-03-16: 1 g via INTRAVENOUS
  Filled 2018-03-16: qty 10

## 2018-03-16 MED ORDER — ONDANSETRON HCL 4 MG/2ML IJ SOLN
4.0000 mg | Freq: Once | INTRAMUSCULAR | Status: AC
  Administered 2018-03-16: 4 mg via INTRAVENOUS
  Filled 2018-03-16: qty 2

## 2018-03-16 MED ORDER — SODIUM CHLORIDE 0.9 % IV BOLUS
1000.0000 mL | Freq: Once | INTRAVENOUS | Status: AC
  Administered 2018-03-16: 1000 mL via INTRAVENOUS

## 2018-03-16 MED ORDER — MORPHINE SULFATE (PF) 2 MG/ML IV SOLN
1.0000 mg | Freq: Once | INTRAVENOUS | Status: AC
  Administered 2018-03-16: 1 mg via INTRAVENOUS
  Filled 2018-03-16: qty 1

## 2018-03-16 MED ORDER — IOPAMIDOL (ISOVUE-300) INJECTION 61%
100.0000 mL | Freq: Once | INTRAVENOUS | Status: AC | PRN
  Administered 2018-03-16: 100 mL via INTRAVENOUS

## 2018-03-16 NOTE — ED Provider Notes (Signed)
[facility] EMERGENCY DEPARTMENT Provider Note   CSN: [id number] Arrival date & time: 03/16/18  1932     History   Chief Complaint Chief Complaint  Patient presents with  . Abdominal Pain    HPI SADAF PRZYBYSZ is a 47 y.o. female.  HPI   Had baby 10/1 Monday afternoon, epigastric pain suddenly developed prior to eating, then threw up every 2-3 hours after that was throwing up. On Monday threw up 5 times.  Since then has had nausea but has not thrown up.  Stomach with severe gurgling and epigastric/RUQ abdominal pain.  Then feels more nauseated. Went to Dr. Wilburn Mylar and got nausea medications, but since then it has worsened, and since 3-4 it is worsening and now upper part middle and sides having severe pain, and worsening nausea. Taking tylenol. Temperature at Dr office yesterday was 100.8.  Thought of food or drinking brings on nausea. Has been drinking sprite, water, ice chips.  Taking medications prescibed by PCP but stomach continues to feel worse.  No diarrhea, had 2BM on Monday, nothing since then, tried colace yesterday without relief.  Thinks has passed gas but barely.  This AM had toast and banana, kept them down.  Now had burning with urinating just when she got here.  Maybe mild flank pain. Feels like abdomen distended.    CS 10/1, no other hx of abdominal surgeries.   Past Medical History:  Diagnosis Date  . AMA (advanced maternal age) multigravida 73+   . Anxiety   . Cancer ([place])    skin  . H/O varicella   . Migraine   . Newborn product of in vitro fertilization (IVF) pregnancy   . PONV (postoperative nausea and vomiting)     Patient Active Problem List   Diagnosis Date Noted  . SBO (small bowel obstruction) ([place]) 03/17/2018  . UTI (urinary tract infection) 03/17/2018  . 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 H/O cesarean section 02/07/2018  . Migraine with aura and without status migrainosus, not intractable  03/11/2017  . Bell's palsy 03/08/2017  . History of gestational diabetes 03/08/2017  . History of melanoma 03/08/2017  . Endometrial hyperplasia without atypia, simple 03/08/2017  . Anxiety 07/29/2015  . Palpitations 07/29/2015  . Hypothyroidism 07/29/2015    Past Surgical History:  Procedure Laterality Date  . CESAREAN SECTION  03/21/2012   Procedure: CESAREAN SECTION;  Surgeon: Allena Katz, MD;  Location: [facility];  Service: Obstetrics;  Laterality: N/A;  . CESAREAN SECTION N/A 02/07/2018   Procedure: REPEAT CESAREAN SECTION;  Surgeon: Everlene Farrier, MD;  Location: [facility];  Service: Obstetrics;  Laterality: N/A;  Repeat edc 02/20/18 allergy to [facility] RNFA  . GYNECOLOGIC CRYOSURGERY    . MELANOMA EXCISION  2005   L arm  . WISDOM TOOTH EXTRACTION  1994     OB History    Gravida  4   Para  3   Term  3   Preterm      AB      Living  3     SAB      TAB      Ectopic      Multiple      Live Births  3            Home Medications    Prior to Admission medications   Medication Sig Start Date End Date Taking? Authorizing Provider  albuterol (PROVENTIL HFA;VENTOLIN HFA) 108 (90  Base) MCG/ACT inhaler Inhale 1 puff into the lungs every 6 (six) hours as needed for wheezing or shortness of breath.    [provider]  ibuprofen (ADVIL,MOTRIN) 600 MG tablet Take 1 tablet (600 mg total) by mouth every 6 (six) hours as needed. 02/10/18   Marylynn Pearson, MD  levothyroxine (SYNTHROID, LEVOTHROID) 50 MCG tablet Take 50-100 mcg by mouth See admin instructions. Take 100 mcg by mouth daily on Saturday and Sunday. Take 50 mcg by mouth daily on all other days. 07/25/15   [provider]  oxyCODONE-acetaminophen (PERCOCET/ROXICET) 5-325 MG tablet Take 1-2 tablets by mouth every 4 (four) hours as needed for severe pain. 02/10/18   Marylynn Pearson, MD  Prenatal Vit-Fe Fumarate-FA (PRENATAL MULTIVITAMIN) TABS tablet Take 1 tablet by mouth  daily.     [provider]  sertraline (ZOLOFT) 100 MG tablet Take 100 mg by mouth daily.     [provider]    Family History Family History  Problem Relation Age of Onset  . Depression Mother   . Hypertension Father   . Diabetes Father   . COPD Maternal Grandmother   . Cancer Maternal Grandmother   . COPD Paternal Grandmother   . Heart attack Paternal Grandfather   . Other Neg Hx     Social History Social History   Tobacco Use  . Smoking status: Never Smoker  . Smokeless tobacco: Never Used  Substance Use Topics  . Alcohol use: No  . Drug use: No     Allergies   Iron; Acyclovir and related; Erythromycin; and Augmentin [amoxicillin-pot clavulanate]   Review of Systems Review of Systems  Constitutional: Negative for fever.  HENT: Negative for sore throat.   Eyes: Negative for visual disturbance.  Respiratory: Negative for cough and shortness of breath.   Cardiovascular: Negative for chest pain.  Gastrointestinal: Positive for abdominal pain, constipation, nausea and vomiting. Negative for diarrhea.  Genitourinary: Positive for dysuria. Negative for difficulty urinating.  Musculoskeletal: Negative for back pain and neck pain.  Skin: Negative for rash.  Neurological: Negative for syncope and headaches.     Physical Exam Updated Vital Signs BP (!) 151/97 (BP Location: Right Arm)   Pulse 81   Temp 98.5 F (36.9 C) (Oral)   Resp 18   Ht 5\' 4"  (1.626 m)   Wt 93.5 kg   LMP 05/14/2017   SpO2 99%   Breastfeeding? Unknown   BMI 35.39 kg/m   Physical Exam  Constitutional: She is oriented to person, place, and time. She appears well-developed and well-nourished. No distress.  HENT:  Head: Normocephalic and atraumatic.  Eyes: Conjunctivae and EOM are normal.  Neck: Normal range of motion.  Cardiovascular: Normal rate, regular rhythm, normal heart sounds and intact distal pulses. Exam reveals no gallop and no friction rub.  No murmur  heard. Pulmonary/Chest: Effort normal and breath sounds normal. No respiratory distress. She has no wheezes. She has no rales.  Abdominal: Soft. She exhibits distension. There is no tenderness. There is guarding and positive Murphy's sign.  Musculoskeletal: She exhibits no edema or tenderness.  Neurological: She is alert and oriented to person, place, and time.  Skin: Skin is warm and dry. No rash noted. She is not diaphoretic. No erythema.  Nursing note and vitals reviewed.    ED Treatments / Results  Labs (all labs ordered are listed, but only abnormal results are displayed) Labs Reviewed  URINALYSIS, ROUTINE W REFLEX MICROSCOPIC - Abnormal; Notable for the following components:  Result Value   APPearance CLOUDY (*)    Specific Gravity, Urine >1.030 (*)    Ketones, ur 15 (*)    Nitrite POSITIVE (*)    Leukocytes, UA SMALL (*)    All other components within normal limits  URINALYSIS, MICROSCOPIC (REFLEX) - Abnormal; Notable for the following components:   Bacteria, UA MANY (*)    All other components within normal limits  URINE CULTURE  PREGNANCY, URINE  LIPASE, BLOOD  COMPREHENSIVE METABOLIC PANEL  CBC  MAGNESIUM    EKG None  Radiology Ct Abdomen Pelvis W Contrast  Result Date: 03/16/2018 CLINICAL DATA:  Abdominal pain with nausea and vomiting for 4 days. Patient was diagnosed with norovirus yesterday by primary care physician and was given medication but no improvement. EXAM: CT ABDOMEN AND PELVIS WITH CONTRAST TECHNIQUE: Multidetector CT imaging of the abdomen and pelvis was performed using the standard protocol following bolus administration of intravenous contrast. CONTRAST:  145mL ISOVUE-300 IOPAMIDOL (ISOVUE-300) INJECTION 61% COMPARISON:  None. FINDINGS: Lower chest: Lung bases are clear. Hepatobiliary: Multiple circumscribed cysts throughout the liver. Gallbladder and bile ducts are unremarkable. Small amount of free fluid around the liver. Pancreas: Unremarkable.  No pancreatic ductal dilatation or surrounding inflammatory changes. Spleen: Normal in size without focal abnormality. Adrenals/Urinary Tract: Adrenal glands are unremarkable. Kidneys are normal, without renal calculi, focal lesion, or hydronephrosis. Bladder is unremarkable. Stomach/Bowel: There is a midline anterior low pelvic wall hernia containing fat and small bowel. There is proximal small bowel obstruction with dilated fluid-filled loops. Scattered air-fluid levels. Distal small bowel is decompressed. Scattered stool in the colon. Residual contrast material in the colon. Appendix is not identified. Vascular/Lymphatic: No significant vascular findings are present. No enlarged abdominal or pelvic lymph nodes. Reproductive: Uterus and bilateral adnexa are unremarkable. Other: Small amount of free fluid in the abdomen may represent ascites. No free air. Musculoskeletal: No acute or significant osseous findings. IMPRESSION: 1. Anterior low pelvic wall hernia containing fat and small bowel with proximal small bowel obstruction. 2. Small amount of free fluid in the abdomen and pelvis, likely ascites. 3. Multiple benign-appearing hepatic cysts. Electronically Signed   By: Lucienne Capers M.D.   On: 03/16/2018 23:09    Procedures Procedures (including critical care time)  Medications Ordered in ED Medications  diatrizoate meglumine-sodium (GASTROGRAFIN) 66-10 % solution 90 mL (has no administration in time range)  lactated ringers bolus 1,000 mL (has no administration in time range)  lactated ringers bolus 1,000 mL (has no administration in time range)  midazolam (VERSED) 2 MG/2ML injection (has no administration in time range)  sodium chloride 0.9 % bolus 1,000 mL (0 mLs Intravenous Stopped 03/16/18 2321)  ondansetron (ZOFRAN) injection 4 mg (4 mg Intravenous Given 03/16/18 2212)  cefTRIAXone (ROCEPHIN) 1 g in sodium chloride 0.9 % 100 mL IVPB (0 g Intravenous Stopped 03/16/18 2322)  morphine 2 MG/ML  injection 1 mg (1 mg Intravenous Given 03/16/18 2215)  iopamidol (ISOVUE-300) 61 % injection 100 mL (100 mLs Intravenous Contrast Given 03/16/18 2234)  midazolam (VERSED) injection 0.5 mg (0.5 mg Intravenous Given 03/17/18 0031)     Initial Impression / Assessment and Plan / ED Course  I have reviewed the triage vital signs and the nursing notes.  Pertinent labs & imaging results that were available during my care of the patient were reviewed by me and considered in my medical decision making (see chart for details).    47 year old female with a history of C-section on October 1 presents with concern for  abdominal pain, nausea and vomiting.  Urinalysis shows evidence of urinary tract infection, given symptoms would suspect this to be pyelonephritis--however, she has significant abdominal pain on history and exam, and differential diagnosis by history, exam also concerning for possible cholecystitis, small bowel obstruction, or postoperative abscess.  Given IV fluids, Zofran, pain medication and Rocephin.  CT abdomen pelvis ordered showing pelvic wall hernia containing fat and small bowel with proximal small bowel obstruction.    Consulted Dr. Johney Maine of General Surgery who recommends NG/SBO protocol, transfer to [facility] for evaluation and admission.     Final Clinical Impressions(s) / ED Diagnoses   Final diagnoses:  SBO (small bowel obstruction) ([facility])  Hernia of anterior abdominal wall  Urinary tract infection without hematuria, site unspecified    ED Discharge Orders    None       Gareth Morgan, MD 03/17/18 0131

## 2018-03-16 NOTE — ED Triage Notes (Signed)
C/o abd pain, n/v x 4 days-seen by PCP yesterday dx "with norovirus-stomach flu"-was given meds-no better-NAD-steady gait

## 2018-03-16 NOTE — ED Notes (Signed)
C/o abd pain and cramping x 4 days w n/c  Has been seen by pcp  States not any better

## 2018-03-17 ENCOUNTER — Emergency Department (HOSPITAL_BASED_OUTPATIENT_CLINIC_OR_DEPARTMENT_OTHER): Payer: Federal, State, Local not specified - PPO

## 2018-03-17 DIAGNOSIS — K430 Incisional hernia with obstruction, without gangrene: Secondary | ICD-10-CM | POA: Diagnosis present

## 2018-03-17 DIAGNOSIS — N390 Urinary tract infection, site not specified: Secondary | ICD-10-CM

## 2018-03-17 DIAGNOSIS — F419 Anxiety disorder, unspecified: Secondary | ICD-10-CM | POA: Diagnosis present

## 2018-03-17 DIAGNOSIS — K56609 Unspecified intestinal obstruction, unspecified as to partial versus complete obstruction: Secondary | ICD-10-CM | POA: Diagnosis present

## 2018-03-17 DIAGNOSIS — R197 Diarrhea, unspecified: Secondary | ICD-10-CM | POA: Diagnosis not present

## 2018-03-17 DIAGNOSIS — K5669 Other partial intestinal obstruction: Secondary | ICD-10-CM | POA: Diagnosis not present

## 2018-03-17 DIAGNOSIS — E039 Hypothyroidism, unspecified: Secondary | ICD-10-CM | POA: Diagnosis present

## 2018-03-17 DIAGNOSIS — Z8632 Personal history of gestational diabetes: Secondary | ICD-10-CM | POA: Diagnosis not present

## 2018-03-17 DIAGNOSIS — E669 Obesity, unspecified: Secondary | ICD-10-CM

## 2018-03-17 DIAGNOSIS — Z809 Family history of malignant neoplasm, unspecified: Secondary | ICD-10-CM | POA: Diagnosis not present

## 2018-03-17 DIAGNOSIS — I10 Essential (primary) hypertension: Secondary | ICD-10-CM | POA: Diagnosis present

## 2018-03-17 DIAGNOSIS — Z888 Allergy status to other drugs, medicaments and biological substances status: Secondary | ICD-10-CM | POA: Diagnosis not present

## 2018-03-17 DIAGNOSIS — Z4682 Encounter for fitting and adjustment of non-vascular catheter: Secondary | ICD-10-CM | POA: Diagnosis not present

## 2018-03-17 DIAGNOSIS — G43909 Migraine, unspecified, not intractable, without status migrainosus: Secondary | ICD-10-CM | POA: Diagnosis present

## 2018-03-17 DIAGNOSIS — Z85820 Personal history of malignant melanoma of skin: Secondary | ICD-10-CM | POA: Diagnosis not present

## 2018-03-17 DIAGNOSIS — Z825 Family history of asthma and other chronic lower respiratory diseases: Secondary | ICD-10-CM | POA: Diagnosis not present

## 2018-03-17 DIAGNOSIS — Z818 Family history of other mental and behavioral disorders: Secondary | ICD-10-CM | POA: Diagnosis not present

## 2018-03-17 DIAGNOSIS — Z79890 Hormone replacement therapy (postmenopausal): Secondary | ICD-10-CM | POA: Diagnosis not present

## 2018-03-17 DIAGNOSIS — Z8249 Family history of ischemic heart disease and other diseases of the circulatory system: Secondary | ICD-10-CM | POA: Diagnosis not present

## 2018-03-17 DIAGNOSIS — Z881 Allergy status to other antibiotic agents status: Secondary | ICD-10-CM | POA: Diagnosis not present

## 2018-03-17 DIAGNOSIS — Z833 Family history of diabetes mellitus: Secondary | ICD-10-CM | POA: Diagnosis not present

## 2018-03-17 LAB — LIPASE, BLOOD: Lipase: 21 U/L (ref 11–51)

## 2018-03-17 LAB — CBC
HCT: 36.8 % (ref 36.0–46.0)
Hemoglobin: 10.4 g/dL — ABNORMAL LOW (ref 12.0–15.0)
MCH: 24.5 pg — ABNORMAL LOW (ref 26.0–34.0)
MCHC: 28.3 g/dL — AB (ref 30.0–36.0)
MCV: 86.8 fL (ref 80.0–100.0)
Platelets: 251 10*3/uL (ref 150–400)
RBC: 4.24 MIL/uL (ref 3.87–5.11)
RDW: 15.6 % — AB (ref 11.5–15.5)
WBC: 8.6 10*3/uL (ref 4.0–10.5)
nRBC: 0 % (ref 0.0–0.2)

## 2018-03-17 LAB — MAGNESIUM: MAGNESIUM: 1.9 mg/dL (ref 1.7–2.4)

## 2018-03-17 LAB — COMPREHENSIVE METABOLIC PANEL WITH GFR
ALT: 13 U/L (ref 0–44)
AST: 18 U/L (ref 15–41)
Albumin: 3.6 g/dL (ref 3.5–5.0)
Alkaline Phosphatase: 81 U/L (ref 38–126)
Anion gap: 8 (ref 5–15)
BUN: 11 mg/dL (ref 6–20)
CO2: 27 mmol/L (ref 22–32)
Calcium: 9 mg/dL (ref 8.9–10.3)
Chloride: 102 mmol/L (ref 98–111)
Creatinine, Ser: 0.86 mg/dL (ref 0.44–1.00)
GFR calc Af Amer: >60 mL/min
GFR calc non Af Amer: >60 mL/min
Glucose, Bld: 89 mg/dL (ref 70–99)
Potassium: 4 mmol/L (ref 3.5–5.1)
Sodium: 137 mmol/L (ref 135–145)
Total Bilirubin: 0.7 mg/dL (ref 0.3–1.2)
Total Protein: 6.3 g/dL — ABNORMAL LOW (ref 6.5–8.1)

## 2018-03-17 LAB — HIV ANTIBODY (ROUTINE TESTING W REFLEX): HIV Screen 4th Generation wRfx: NONREACTIVE

## 2018-03-17 MED ORDER — ONDANSETRON HCL 4 MG/2ML IJ SOLN
4.0000 mg | Freq: Four times a day (QID) | INTRAMUSCULAR | Status: DC | PRN
  Administered 2018-03-17 – 2018-03-18 (×3): 4 mg via INTRAVENOUS
  Filled 2018-03-17 (×3): qty 2

## 2018-03-17 MED ORDER — HYDROCORTISONE 1 % EX CREA
1.0000 "application " | TOPICAL_CREAM | Freq: Three times a day (TID) | CUTANEOUS | Status: DC | PRN
  Filled 2018-03-17: qty 28

## 2018-03-17 MED ORDER — ONDANSETRON 4 MG PO TBDP: 4.0000 mg | ORAL_TABLET | Freq: Four times a day (QID) | ORAL | Status: DC | PRN

## 2018-03-17 MED ORDER — DIPHENHYDRAMINE HCL 50 MG/ML IJ SOLN
12.5000 mg | Freq: Four times a day (QID) | INTRAMUSCULAR | Status: DC | PRN
  Administered 2018-03-18: 12.5 mg via INTRAVENOUS
  Filled 2018-03-17 (×2): qty 1

## 2018-03-17 MED ORDER — HYDROCORTISONE 2.5 % RE CREA
1.0000 "application " | TOPICAL_CREAM | Freq: Four times a day (QID) | RECTAL | Status: DC | PRN
  Filled 2018-03-17: qty 28.35

## 2018-03-17 MED ORDER — METHOCARBAMOL 1000 MG/10ML IJ SOLN
1000.0000 mg | Freq: Four times a day (QID) | INTRAVENOUS | Status: DC | PRN
  Filled 2018-03-17: qty 10

## 2018-03-17 MED ORDER — LACTATED RINGERS IV BOLUS
1000.0000 mL | Freq: Once | INTRAVENOUS | Status: AC
  Administered 2018-03-17: 1000 mL via INTRAVENOUS

## 2018-03-17 MED ORDER — HYDROMORPHONE HCL 1 MG/ML IJ SOLN
0.5000 mg | INTRAMUSCULAR | Status: DC | PRN
  Administered 2018-03-17 – 2018-03-18 (×3): 1 mg via INTRAVENOUS
  Filled 2018-03-17 (×3): qty 1

## 2018-03-17 MED ORDER — MIDAZOLAM HCL 2 MG/2ML IJ SOLN
0.5000 mg | Freq: Once | INTRAMUSCULAR | Status: AC
  Administered 2018-03-17: 0.5 mg via INTRAVENOUS

## 2018-03-17 MED ORDER — ENOXAPARIN SODIUM 40 MG/0.4ML ~~LOC~~ SOLN
40.0000 mg | SUBCUTANEOUS | Status: DC
  Administered 2018-03-17 – 2018-03-18 (×2): 40 mg via SUBCUTANEOUS
  Filled 2018-03-17 (×3): qty 0.4

## 2018-03-17 MED ORDER — MIDAZOLAM HCL 2 MG/2ML IJ SOLN
INTRAMUSCULAR | Status: AC
  Filled 2018-03-17: qty 2

## 2018-03-17 MED ORDER — LACTATED RINGERS IV BOLUS: 1000.0000 mL | Freq: Three times a day (TID) | INTRAVENOUS | Status: DC | PRN

## 2018-03-17 MED ORDER — LIP MEDEX EX OINT
1.0000 "application " | TOPICAL_OINTMENT | Freq: Two times a day (BID) | CUTANEOUS | Status: DC
  Administered 2018-03-17 – 2018-03-19 (×6): 1 via TOPICAL
  Filled 2018-03-17 (×3): qty 7

## 2018-03-17 MED ORDER — SIMETHICONE 80 MG PO CHEW
40.0000 mg | CHEWABLE_TABLET | Freq: Four times a day (QID) | ORAL | Status: DC | PRN
  Filled 2018-03-17: qty 1

## 2018-03-17 MED ORDER — GUAIFENESIN-DM 100-10 MG/5ML PO SYRP: 10.0000 mL | ORAL_SOLUTION | ORAL | Status: DC | PRN

## 2018-03-17 MED ORDER — DIPHENHYDRAMINE HCL 50 MG/ML IJ SOLN
12.5000 mg | Freq: Four times a day (QID) | INTRAMUSCULAR | Status: DC | PRN
  Administered 2018-03-17 – 2018-03-19 (×2): 25 mg via INTRAVENOUS
  Filled 2018-03-17 (×2): qty 1

## 2018-03-17 MED ORDER — LORAZEPAM 2 MG/ML IJ SOLN
1.0000 mg | Freq: Once | INTRAMUSCULAR | Status: AC
  Administered 2018-03-17: 1 mg via INTRAVENOUS
  Filled 2018-03-17: qty 1

## 2018-03-17 MED ORDER — ACETAMINOPHEN 650 MG RE SUPP: 650.0000 mg | Freq: Four times a day (QID) | RECTAL | Status: DC | PRN

## 2018-03-17 MED ORDER — LORAZEPAM 2 MG/ML IJ SOLN
0.5000 mg | Freq: Three times a day (TID) | INTRAMUSCULAR | Status: DC | PRN
  Administered 2018-03-18: 1 mg via INTRAVENOUS
  Filled 2018-03-17: qty 1

## 2018-03-17 MED ORDER — PROCHLORPERAZINE EDISYLATE 10 MG/2ML IJ SOLN: 5.0000 mg | INTRAMUSCULAR | Status: DC | PRN

## 2018-03-17 MED ORDER — KETOROLAC TROMETHAMINE 15 MG/ML IJ SOLN
15.0000 mg | Freq: Four times a day (QID) | INTRAMUSCULAR | Status: DC | PRN
  Administered 2018-03-17 (×2): 15 mg via INTRAVENOUS
  Administered 2018-03-17 – 2018-03-18 (×2): 30 mg via INTRAVENOUS
  Filled 2018-03-17: qty 1
  Filled 2018-03-17 (×3): qty 2
  Filled 2018-03-17: qty 1

## 2018-03-17 MED ORDER — ALUM & MAG HYDROXIDE-SIMETH 200-200-20 MG/5ML PO SUSP: 30.0000 mL | Freq: Four times a day (QID) | ORAL | Status: DC | PRN

## 2018-03-17 MED ORDER — METOPROLOL TARTRATE 5 MG/5ML IV SOLN: 5.0000 mg | Freq: Four times a day (QID) | INTRAVENOUS | Status: DC | PRN

## 2018-03-17 MED ORDER — LACTATED RINGERS IV SOLN
INTRAVENOUS | Status: DC
  Administered 2018-03-17 – 2018-03-19 (×5): via INTRAVENOUS

## 2018-03-17 MED ORDER — CIPROFLOXACIN IN D5W 400 MG/200ML IV SOLN
400.0000 mg | Freq: Two times a day (BID) | INTRAVENOUS | Status: AC
  Administered 2018-03-17 – 2018-03-18 (×3): 400 mg via INTRAVENOUS
  Filled 2018-03-17 (×3): qty 200

## 2018-03-17 MED ORDER — HYDRALAZINE HCL 20 MG/ML IJ SOLN: 5.0000 mg | INTRAMUSCULAR | Status: DC | PRN

## 2018-03-17 MED ORDER — DIATRIZOATE MEGLUMINE & SODIUM 66-10 % PO SOLN
90.0000 mL | Freq: Once | ORAL | Status: AC
  Administered 2018-03-17: 90 mL via NASOGASTRIC
  Filled 2018-03-17 (×2): qty 90

## 2018-03-17 MED ORDER — BISACODYL 10 MG RE SUPP
10.0000 mg | Freq: Every day | RECTAL | Status: DC
  Administered 2018-03-17 – 2018-03-18 (×2): 10 mg via RECTAL
  Filled 2018-03-17 (×3): qty 1

## 2018-03-17 MED ORDER — MENTHOL 3 MG MT LOZG
1.0000 | LOZENGE | OROMUCOSAL | Status: DC | PRN
  Filled 2018-03-17: qty 9

## 2018-03-17 MED ORDER — METOCLOPRAMIDE HCL 5 MG/ML IJ SOLN: 5.0000 mg | Freq: Three times a day (TID) | INTRAMUSCULAR | Status: DC | PRN

## 2018-03-17 MED ORDER — MAGIC MOUTHWASH
15.0000 mL | Freq: Four times a day (QID) | ORAL | Status: DC | PRN
  Filled 2018-03-17: qty 15

## 2018-03-17 MED ORDER — PHENOL 1.4 % MT LIQD
1.0000 | OROMUCOSAL | Status: DC | PRN
  Filled 2018-03-17: qty 177

## 2018-03-17 NOTE — Progress Notes (Signed)
OB  Comfortable, ambulating some, passing some flatus/stool  Vitals:   03/17/18 0845 03/17/18 1230  BP:  124/81  Pulse:  78  Resp:  16  Temp:  98.1 F (36.7 C)  SpO2: 95% 96%    Abdomen soft, few BS Incision looks good  A/P: S/P C/S with SBO-D/W patient         Appreciate Surgery assistance         Dr Julien Girt on call this weekend for our group

## 2018-03-17 NOTE — Plan of Care (Signed)
Pt alert and oriented, resting with complaints of epigastric pain, bloating.  States she is passing gas and has some nausea.  Plan of care discussed with pt. RN will monitor.

## 2018-03-17 NOTE — ED Notes (Signed)
Patient assisted to bedside commode.

## 2018-03-17 NOTE — Consult Note (Signed)
[name]  05-31-1970 [id number]  CARE TEAM:  PCP: Harlan Stains, MD  Outpatient Care Team: Patient Care Team: Harlan Stains, MD as PCP - General (Family Medicine) Everlene Farrier, MD as Consulting Physician (Obstetrics and Gynecology)  Inpatient Treatment Team: Treatment Team: Attending Provider: Gareth Morgan, MD; Registered Nurse: Argentina Ponder, RN; Consulting Physician: Nolon Nations, MD; Consulting Physician: Everlene Farrier, MD   This patient is a 47 y.o.female who presents today for surgical evaluation at the request of Dr Billy Fischer, [facility] ED.   Chief complaint / Reason for evaluation: Nausea vomiting abdominal pain and small bowel obstruction.  47 year old female who had C-section Dr. Radene Journey 02/07/2018.  Had some gestational diabetes in the past.  Required in vitro fertilization.  This time had some gestational hypertension.  Went home postoperative day 3.  Had been recovering relatively well until 3 days ago developed crampy epigastric pain.  Nausea vomiting.  Threw up many times at the beginning of this week.  Followed up at primary care office and treated with some nausea medications.  Abdomen with worsening pain and cramping.  Last bowel movement on Monday, 4 days ago.  Felt more distended.  Concerned.  Came to emergency room.  Examination CT scan concerning for bowel obstruction.  Concern for small bowel within abdominal wall/incarcerated hernia at Pfannenstiel incision.  Surgical consultation requested.  Husband and emergency room nurse at bedside.  Patient notes a little bit of cramping.  Thought she passed a little bit of gas an hour or 2 ago.  However no major flatus.  Normally can walk 20 minutes without difficulty.  No history of wound infections.     Assessment  [name]  47 y.o. female       Problem List:  Principal Problem:   SBO (small bowel obstruction) (HCC) Active Problems:   Anxiety   H/O cesarean section   UTI (urinary tract  infection)   Nausea vomiting abdominal pain with evidence of bowel obstruction and partial disruption of fascia at Pfannenstiel incision from C-section a month ago  Plan:  Admit.  IV fluids.  Treat urinary tract infection.  Cannot take p.o.  So we will give IV.  72 hours.  IV Cipro given questionable allergy to amoxicillin.  Small bowel protocol.  Possible need for surgery.  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  The challenge is that adhesions will be significant this soon after C-section surgery.  We will see if this will open up nonoperatively but most likely will have a low threshold to consider operative intervention.  Follow closely clinically with small bowel protocol.  She does not have any evidence of peritonitis or perforation.  She is not toxic.  She does not require emergency surgery in the middle the night.  Will defer to my partner, Dr. Marlou Starks, who will assume care this morning  -VTE prophylaxis- SCDs, etc -mobilize as tolerated to help recovery  47 minutes spent in review, evaluation, examination, counseling, and coordination of care.  More than 50% of that time was spent in counseling.  Adin Hector, MD, FACS, MASCRS Gastrointestinal and Minimally Invasive Surgery    [address], [place], [place] [address] [phone] Main / Paging [phone] Fax   03/17/2018  Past Medical History:  Diagnosis Date  . AMA (advanced maternal age) multigravida 9+   . Anxiety   . Cancer ([place])    skin  . H/O varicella   . Migraine   . Newborn product of in vitro fertilization (IVF) pregnancy   . PONV (postoperative nausea and vomiting)     Past Surgical History:  Procedure Laterality Date    . CESAREAN SECTION  03/21/2012   Procedure: CESAREAN SECTION;  Surgeon: Allena Katz, MD;  Location: [facility];  Service: Obstetrics;  Laterality: N/A;  . CESAREAN SECTION N/A 02/07/2018   Procedure: REPEAT CESAREAN SECTION;  Surgeon: Everlene Farrier, MD;  Location: [place];  Service: Obstetrics;  Laterality: N/A;  Repeat edc 02/20/18 allergy to [facility] RNFA  . GYNECOLOGIC CRYOSURGERY    . MELANOMA EXCISION  2005   L arm  . Strang EXTRACTION  1994    Social History   Socioeconomic History  . Marital status: Married    Spouse name: Not on file  . Number of children: 3  . Years of education: Not on file  . Highest education level: Not on file  Occupational History    Comment: services assistant - [employer]  Social Needs  . Financial resource strain: Not hard at all  . Food insecurity:    Worry: Never true    Inability: Never true  . Transportation needs:    Medical: No    Non-medical: Not on file  Tobacco Use  . Smoking status: Never Smoker  . Smokeless tobacco: Never Used  Substance and Sexual Activity  . Alcohol use: No  . Drug use: No  . Sexual activity: Not on file  Lifestyle  . Physical activity:    Days per week: 0 days    Minutes per session: 0 min  . Stress: To some extent  Relationships  . Social connections:    Talks on phone: Not on file    Gets together: Not on file    Attends religious service: Not on file    Active member of club or organization: Not on file    Attends meetings of clubs or organizations: Not on file    Relationship status: Not on file  . Intimate partner violence:    Fear of current or ex partner: No    Emotionally abused: No    Physically abused: No    Forced sexual activity: No  Other Topics Concern  . Not on file  Social History Narrative   Epworth Sleepiness Scale Score: 6 (07/29/15)      --I feel stressed and lack motivation   --I am overweight or am gaining weight         Pt has  been married for 20 years, has three children, and lives at home with husband and children. She cleans house, shops, does yard work, and drives. 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 She completed Sophomore year in college.    Family History  Problem Relation Age of Onset  . Depression Mother   . Hypertension Father   . Diabetes Father   . COPD Maternal Grandmother   . Cancer Maternal Grandmother   . COPD Paternal Grandmother   . Heart attack Paternal Grandfather   . Other Neg Hx     Current Facility-Administered Medications  Medication Dose Route Frequency Provider Last Rate Last Dose  . diatrizoate meglumine-sodium (GASTROGRAFIN) 66-10 % solution 90 mL  90 mL Per NG tube Once  Michael Boston, MD      . lactated ringers bolus 1,000 mL  1,000 mL Intravenous Once Michael Boston, MD      . lactated ringers bolus 1,000 mL  1,000 mL Intravenous TID PRN Michael Boston, MD      . midazolam (VERSED) 2 MG/2ML injection            Current Outpatient Medications  Medication Sig Dispense Refill  . albuterol (PROVENTIL HFA;VENTOLIN HFA) 108 (90 Base) MCG/ACT inhaler Inhale 1 puff into the lungs every 6 (six) hours as needed for wheezing or shortness of breath.    Marland Kitchen ibuprofen (ADVIL,MOTRIN) 600 MG tablet Take 1 tablet (600 mg total) by mouth every 6 (six) hours as needed. 30 tablet 0  . levothyroxine (SYNTHROID, LEVOTHROID) 50 MCG tablet Take 50-100 mcg by mouth See admin instructions. Take 100 mcg by mouth daily on Saturday and Sunday. Take 50 mcg by mouth daily on all other days.  0  . oxyCODONE-acetaminophen (PERCOCET/ROXICET) 5-325 MG tablet Take 1-2 tablets by mouth every 4 (four) hours as needed for severe pain. 30 tablet 0  . Prenatal Vit-Fe Fumarate-FA (PRENATAL MULTIVITAMIN) TABS tablet Take 1 tablet by mouth daily.     . sertraline (ZOLOFT) 100 MG tablet Take 100 mg by mouth daily.        Allergies  Allergen Reactions  . Iron Anaphylaxis and Other (See Comments)     Iron infusion reaction  . Acyclovir And Related Other (See Comments)    Unknown  . Erythromycin Nausea And Vomiting  . Augmentin [Amoxicillin-Pot Clavulanate] Nausea And Vomiting, Anxiety and Other (See Comments)    "panic attack"    ROS:   All other systems reviewed & are negative except per HPI or as noted below: Constitutional:  No fevers, chills, sweats.  Weight stable Eyes:  No vision changes, No discharge HENT:  No sore throats, nasal drainage Lymph: No neck swelling, No bruising easily Pulmonary:  No cough, productive sputum CV: No orthopnea, PND  Patient walks 30 minutes for about 1 miles without difficulty.  No exertional chest/neck/shoulder/arm pain. GI: No personal nor family history of GI/colon cancer, inflammatory bowel disease, irritable bowel syndrome, allergy such as Celiac Sprue, dietary/dairy problems, colitis, ulcers nor gastritis.  No recent sick contacts/gastroenteritis.  No travel outside the country.  No changes in diet. Renal: No UTIs, No hematuria Genital:  No drainage, bleeding, masses Musculoskeletal: No severe joint pain.  Good ROM major joints Skin:  No sores or lesions.  No rashes Heme/Lymph:  No easy bleeding.  No swollen lymph nodes Neuro: No focal weakness/numbness.  No seizures Psych: No suicidal ideation.  No hallucinations  BP (!) 151/97 (BP Location: Right Arm)   Pulse 81   Temp 98.5 F (36.9 C) (Oral)   Resp 18   Ht 5\' 4"  (1.626 m)   Wt 93.5 kg   LMP 05/14/2017   SpO2 99%   Breastfeeding? Unknown   BMI 35.39 kg/m   Physical Exam: General: Pt awake/alert/oriented x4 in no major acute distress Eyes: PERRL, normal EOM. Sclera nonicteric Neuro: CN II-XII intact w/o focal sensory/motor deficits. Lymph: No head/neck/groin lymphadenopathy Psych:  No delerium/psychosis/paranoia.  Anxious but consolable.  Asks many appropriate questions. HENT: Normocephalic, Mucus membranes moist.  No thrush Neck: Supple, No tracheal deviation Chest: No pain.   Good respiratory excursion. CV:  Pulses intact.  Regular rhythm Abdomen: Soft obese, Mildly distended.  Mild discomfort in suprapubic region but no erythema.  No guarding.  Nontender.  No  incarcerated hernias. Gen:  No inguinal hernias.  No inguinal lymphadenopathy.   Ext:  SCDs BLE.  No significant edema.  No cyanosis Skin: No petechiae / purpurea.  No major sores Musculoskeletal: No severe joint pain.  Good ROM major joints   Results:   Labs: Results for orders placed or performed during the hospital encounter of 03/16/18 (from the past 48 hour(s))  Urinalysis, Routine w reflex microscopic     Status: Abnormal   Collection Time: 03/16/18  8:21 PM  Result Value Ref Range   Color, Urine YELLOW YELLOW   APPearance CLOUDY (A) CLEAR   Specific Gravity, Urine >1.030 (H) 1.005 - 1.030   pH 6.0 5.0 - 8.0   Glucose, UA NEGATIVE NEGATIVE mg/dL   Hgb urine dipstick NEGATIVE NEGATIVE   Bilirubin Urine NEGATIVE NEGATIVE   Ketones, ur 15 (A) NEGATIVE mg/dL   Protein, ur NEGATIVE NEGATIVE mg/dL   Nitrite POSITIVE (A) NEGATIVE   Leukocytes, UA SMALL (A) NEGATIVE    Comment: Performed at [facility], [address] [place], [place] [address]  Pregnancy, urine     Status: None   Collection Time: 03/16/18  8:21 PM  Result Value Ref Range   Preg Test, Ur NEGATIVE NEGATIVE    Comment:        THE SENSITIVITY OF THIS METHODOLOGY IS >20 mIU/mL. Performed at [facility], [address] [place], [place] [address]   Urinalysis, Microscopic (reflex)     Status: Abnormal   Collection Time: 03/16/18  8:21 PM  Result Value Ref Range   RBC / HPF 0-5 0 - 5 RBC/hpf   WBC, UA 21-50 0 - 5 WBC/hpf   Bacteria, UA MANY (A) NONE SEEN   Squamous Epithelial / LPF 0-5 0 - 5    Comment: Performed at [facility], [address] [place], [place] [address]    Imaging / Studies: Ct Abdomen Pelvis W Contrast  Result Date: 03/16/2018 CLINICAL DATA:  Abdominal pain with nausea  and vomiting for 4 days. Patient was diagnosed with norovirus yesterday by primary care physician and was given medication but no improvement. EXAM: CT ABDOMEN AND PELVIS WITH CONTRAST TECHNIQUE: Multidetector CT imaging of the abdomen and pelvis was performed using the standard protocol following bolus administration of intravenous contrast. CONTRAST:  15mL ISOVUE-300 IOPAMIDOL (ISOVUE-300) INJECTION 61% COMPARISON:  None. FINDINGS: Lower chest: Lung bases are clear. Hepatobiliary: Multiple circumscribed cysts throughout the liver. Gallbladder and bile ducts are unremarkable. Small amount of free fluid around the liver. Pancreas: Unremarkable. No pancreatic ductal dilatation or surrounding inflammatory changes. Spleen: Normal in size without focal abnormality. Adrenals/Urinary Tract: Adrenal glands are unremarkable. Kidneys are normal, without renal calculi, focal lesion, or hydronephrosis. Bladder is unremarkable. Stomach/Bowel: There is a midline anterior low pelvic wall hernia containing fat and small bowel. There is proximal small bowel obstruction with dilated fluid-filled loops. Scattered air-fluid levels. Distal small bowel is decompressed. Scattered stool in the colon. Residual contrast material in the colon. Appendix is not identified. Vascular/Lymphatic: No significant vascular findings are present. No enlarged abdominal or pelvic lymph nodes. Reproductive: Uterus and bilateral adnexa are unremarkable. Other: Small amount of free fluid in the abdomen may represent ascites. No free air. Musculoskeletal: No acute or significant osseous findings. IMPRESSION: 1. Anterior low pelvic wall hernia containing fat and small bowel with proximal small bowel obstruction. 2. Small amount of free fluid in the abdomen and pelvis, likely ascites. 3. Multiple benign-appearing hepatic  cysts. Electronically Signed   By: Lucienne Capers M.D.   On: 03/16/2018 23:09    Medications / Allergies: per  chart  Antibiotics: Anti-infectives (From admission, onward)   Start     Dose/Rate Route Frequency Ordered Stop   03/16/18 2130  cefTRIAXone (ROCEPHIN) 1 g in sodium chloride 0.9 % 100 mL IVPB     1 g 200 mL/hr over 30 Minutes Intravenous  Once 03/16/18 2120 03/16/18 2322        Note: Portions of this report may have been transcribed using voice recognition software. Every effort was made to ensure accuracy; however, inadvertent computerized transcription errors may be present.   Any transcriptional errors that result from this process are unintentional.    Adin Hector, MD, FACS, MASCRS Gastrointestinal and Minimally Invasive Surgery    [address] [place], [place] [address] [phone] Main / Paging [phone] Fax   03/17/2018

## 2018-03-17 NOTE — Progress Notes (Signed)
S: Feels tired. Pain improving. Occasional nausea, pt relates to pain. Had a BM this AM, several hard/dark stool balls. Passing flatus  O: GEN: alert, NAD CV: RRR, distal pulses intact Resp: normal effort, CTAB GI: abdomen soft, mild TTP in upper abdomen, BS hypoactive, no guarding or peritonitis; NGT with bilious drainage  A/P: UTI - continue IV abx Anxiety  Hx of cesarean section 02/07/18 Dr. 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

## 2018-03-17 NOTE — ED Provider Notes (Signed)
Assumed care of patient after transfer from [place].  She is here to see [facility] surgery.  Patient has a postoperative bowel obstruction.  She currently has an NG tube in place she is requesting medications for anxiety.  She is awaiting admission.  Consult to surgery has been placed   Ripley Fraise, MD 03/17/18 [phone]

## 2018-03-17 NOTE — ED Notes (Signed)
ED TO INPATIENT HANDOFF REPORT  Name/Age/Gender [name] 47 y.o. female  Code Status    Code Status Orders  (From admission, onward)         Start     Ordered   03/17/18 0301  Full code  Continuous     03/17/18 0306        Code Status History    Date Active Date Inactive Code Status Order ID Comments User Context   02/07/2018 1116 02/10/2018 1336 Full Code [id number]  Everlene Farrier, MD Inpatient   03/21/2012 2337 03/24/2012 0028 Full Code [id number]  Tinnie Gens, RN Inpatient   03/21/2012 0744 03/21/2012 1901 Full Code [id number]  Allena Katz, MD Inpatient      Home/SNF/Other Home  Chief Complaint abdominal pain  Level of Care/Admitting Diagnosis ED Disposition    ED Disposition Condition [place] [facility] Area: [facility] [[id number]]  Level of Care: Med-Surg [16]  Diagnosis: Incarcerated incisional hernia [413244]  Admitting Physician: Jerauld, [place]  Attending Physician: CCS, MD [[id number]]  Estimated length of stay: 3 - 4 days  Certification:: I certify this patient will need inpatient services for at least 2 midnights  Bed request comments: 5W preferred - SBO patient  PT Class (Do Not Modify): Inpatient [101]  PT Acc Code (Do Not Modify): Private [1]       Medical History Past Medical History:  Diagnosis Date  . AMA (advanced maternal age) multigravida 36+   . Anxiety   . Cancer ([place])    skin  . H/O varicella   . Migraine   . Newborn product of in vitro fertilization (IVF) pregnancy   . PONV (postoperative nausea and vomiting)     Allergies Allergies  Allergen Reactions  . Iron Anaphylaxis and Other (See Comments)    Iron infusion reaction  . Acyclovir And Related Other (See Comments)    Unknown  . Erythromycin Nausea And Vomiting  . Augmentin [Amoxicillin-Pot Clavulanate] Nausea And Vomiting, Anxiety and Other (See Comments)    "panic attack"    IV Location/Drains/Wounds Patient Lines/Drains/Airways  Status   Active Line/Drains/Airways    Name:   Placement date:   Placement time:   Site:   Days:   Peripheral IV 03/16/18 Right Antecubital   03/16/18    2153    Antecubital   1   NG/OG Tube Nasogastric 14 Fr. Right nare Xray;Aucultation   03/17/18    0036    Right nare   less than 1          Labs/Imaging Results for orders placed or performed during the hospital encounter of 03/16/18 (from the past 48 hour(s))  Urinalysis, Routine w reflex microscopic     Status: Abnormal   Collection Time: 03/16/18  8:21 PM  Result Value Ref Range   Color, Urine YELLOW YELLOW   APPearance CLOUDY (A) CLEAR   Specific Gravity, Urine >1.030 (H) 1.005 - 1.030   pH 6.0 5.0 - 8.0   Glucose, UA NEGATIVE NEGATIVE mg/dL   Hgb urine dipstick NEGATIVE NEGATIVE   Bilirubin Urine NEGATIVE NEGATIVE   Ketones, ur 15 (A) NEGATIVE mg/dL   Protein, ur NEGATIVE NEGATIVE mg/dL   Nitrite POSITIVE (A) NEGATIVE   Leukocytes, UA SMALL (A) NEGATIVE    Comment: Performed at [facility], [address] [place], [place] [address]  Pregnancy, urine     Status: None   Collection Time: 03/16/18  8:21 PM  Result  Value Ref Range   Preg Test, Ur NEGATIVE NEGATIVE    Comment:        THE SENSITIVITY OF THIS METHODOLOGY IS >20 mIU/mL. Performed at [facility], [address] [place], [place] [address]   Urinalysis, Microscopic (reflex)     Status: Abnormal   Collection Time: 03/16/18  8:21 PM  Result Value Ref Range   RBC / HPF 0-5 0 - 5 RBC/hpf   WBC, UA 21-50 0 - 5 WBC/hpf   Bacteria, UA MANY (A) NONE SEEN   Squamous Epithelial / LPF 0-5 0 - 5    Comment: Performed at [facility], [address] [place], [place] [address]   Dg Abd 1 View  Result Date: 03/17/2018 CLINICAL DATA:  NG tube placement EXAM: ABDOMEN - 1 VIEW COMPARISON:  None. FINDINGS: Enteric tube tip is in the left upper quadrant consistent with location in the upper stomach. Proximal side hole is below the expected  location of the EG junction. Upper abdominal dilated gas-filled small bowel consistent with obstruction. Residual contrast material in the renal collecting systems. IMPRESSION: Enteric tube tip is in the left upper quadrant consistent with location in the upper stomach. Electronically Signed   By: Lucienne Capers M.D.   On: 03/17/2018 01:05   Ct Abdomen Pelvis W Contrast  Result Date: 03/16/2018 CLINICAL DATA:  Abdominal pain with nausea and vomiting for 4 days. Patient was diagnosed with norovirus yesterday by primary care physician and was given medication but no improvement. EXAM: CT ABDOMEN AND PELVIS WITH CONTRAST TECHNIQUE: Multidetector CT imaging of the abdomen and pelvis was performed using the standard protocol following bolus administration of intravenous contrast. CONTRAST:  137mL ISOVUE-300 IOPAMIDOL (ISOVUE-300) INJECTION 61% COMPARISON:  None. FINDINGS: Lower chest: Lung bases are clear. Hepatobiliary: Multiple circumscribed cysts throughout the liver. Gallbladder and bile ducts are unremarkable. Small amount of free fluid around the liver. Pancreas: Unremarkable. No pancreatic ductal dilatation or surrounding inflammatory changes. Spleen: Normal in size without focal abnormality. Adrenals/Urinary Tract: Adrenal glands are unremarkable. Kidneys are normal, without renal calculi, focal lesion, or hydronephrosis. Bladder is unremarkable. Stomach/Bowel: There is a midline anterior low pelvic wall hernia containing fat and small bowel. There is proximal small bowel obstruction with dilated fluid-filled loops. Scattered air-fluid levels. Distal small bowel is decompressed. Scattered stool in the colon. Residual contrast material in the colon. Appendix is not identified. Vascular/Lymphatic: No significant vascular findings are present. No enlarged abdominal or pelvic lymph nodes. Reproductive: Uterus and bilateral adnexa are unremarkable. Other: Small amount of free fluid in the abdomen may represent  ascites. No free air. Musculoskeletal: No acute or significant osseous findings. IMPRESSION: 1. Anterior low pelvic wall hernia containing fat and small bowel with proximal small bowel obstruction. 2. Small amount of free fluid in the abdomen and pelvis, likely ascites. 3. Multiple benign-appearing hepatic cysts. Electronically Signed   By: Lucienne Capers M.D.   On: 03/16/2018 23:09   None  Pending Labs Unresulted Labs (From admission, onward)    Start     Ordered   03/24/18 0500  Creatinine, serum  (enoxaparin (LOVENOX)    CrCl >/= 30 ml/min)  Weekly,   R    Comments:  while on enoxaparin therapy    03/17/18 0306   03/17/18 0301  HIV antibody (Routine Testing)  Once,   R     03/17/18 0306   03/17/18 0039  Lipase, blood  Once,   R    Question:  Specimen collection method  Answer:  IV Team   03/17/18 0038   03/17/18 0039  Comprehensive metabolic panel  Once,   R    Comments:  If K < 3.5, give 43mEq KCl in 10MEq runs per protocol.  Pharmacy may adjust dosing strength, schedule, rate of infusion, etc as needed to optimize therapy   Question:  Specimen collection method  Answer:  IV Team   03/17/18 0038   03/17/18 0039  CBC  Once,   R    Comments:  If Hgb <8, D/C heparin & other anticoagulation   Question:  Specimen collection method  Answer:  IV Team   03/17/18 0038   03/17/18 0039  Magnesium  Once,   R    Comments:  If Mag Level is >2, it is adequateIf Mag level is 1.5-2, give 2g IV Magnesium sulfate x 1If Mag level < 1.4, give 4g  IV Magnesium sulfate x 1   Question:  Specimen collection method  Answer:  IV Team   03/17/18 0038   03/17/18 0023  Urine Culture  Once,   R    Question:  Patient immune status  Answer:  Normal   03/17/18 0022          Vitals/Pain Today's Vitals   03/17/18 0630 03/17/18 0700 03/17/18 0705 03/17/18 0730  BP: (!) 152/96 (!) 142/95  (!) 136/91  Pulse: 89 73  77  Resp: 16 14  14   Temp:      TempSrc:      SpO2: 95% 91%  (!) 87%  Weight:       Height:      PainSc:   2      Isolation Precautions No active isolations  Medications Medications  lactated ringers bolus 1,000 mL (has no administration in time range)  enoxaparin (LOVENOX) injection 40 mg (has no administration in time range)  lactated ringers infusion ( Intravenous Rate/Dose Verify 03/17/18 9735)  acetaminophen (TYLENOL) suppository 650 mg (has no administration in time range)  diphenhydrAMINE (BENADRYL) injection 12.5 mg (has no administration in time range)  ondansetron (ZOFRAN-ODT) disintegrating tablet 4 mg ( Oral See Alternative 03/17/18 0347)    Or  ondansetron (ZOFRAN) injection 4 mg (4 mg Intravenous Given 03/17/18 0347)  simethicone (MYLICON) chewable tablet 40 mg (has no administration in time range)  ciprofloxacin (CIPRO) IVPB 400 mg (0 mg Intravenous Stopped 03/17/18 0604)  HYDROmorphone (DILAUDID) injection 0.5-2 mg (1 mg Intravenous Given 03/17/18 0626)  methocarbamol (ROBAXIN) 1,000 mg in dextrose 5 % 50 mL IVPB (has no administration in time range)  ketorolac (TORADOL) 15 MG/ML injection 15-30 mg (has no administration in time range)  prochlorperazine (COMPAZINE) injection 5-10 mg (has no administration in time range)  metoCLOPramide (REGLAN) injection 5-10 mg (has no administration in time range)  lip balm (CARMEX) ointment 1 application (1 application Topical Given 03/17/18 0631)  magic mouthwash (has no administration in time range)  bisacodyl (DULCOLAX) suppository 10 mg (10 mg Rectal Given 03/17/18 0512)  guaiFENesin-dextromethorphan (ROBITUSSIN DM) 100-10 MG/5ML syrup 10 mL (has no administration in time range)  hydrocortisone (ANUSOL-HC) 2.5 % rectal cream 1 application (has no administration in time range)  alum & mag hydroxide-simeth (MAALOX/MYLANTA) 200-200-20 MG/5ML suspension 30 mL (has no administration in time range)  hydrocortisone cream 1 % 1 application (has no administration in time range)  menthol-cetylpyridinium (CEPACOL) lozenge 3 mg  (has no administration in time range)  phenol (CHLORASEPTIC) mouth spray 1-2 spray (has no administration in time range)  diphenhydrAMINE (  BENADRYL) injection 12.5-25 mg (has no administration in time range)  metoprolol tartrate (LOPRESSOR) injection 5 mg (has no administration in time range)  LORazepam (ATIVAN) injection 0.5-1 mg (has no administration in time range)  hydrALAZINE (APRESOLINE) injection 5-10 mg (has no administration in time range)  sodium chloride 0.9 % bolus 1,000 mL (0 mLs Intravenous Stopped 03/16/18 2321)  ondansetron (ZOFRAN) injection 4 mg (4 mg Intravenous Given 03/16/18 2212)  cefTRIAXone (ROCEPHIN) 1 g in sodium chloride 0.9 % 100 mL IVPB (0 g Intravenous Stopped 03/16/18 2322)  morphine 2 MG/ML injection 1 mg (1 mg Intravenous Given 03/16/18 2215)  iopamidol (ISOVUE-300) 61 % injection 100 mL (100 mLs Intravenous Contrast Given 03/16/18 2234)  diatrizoate meglumine-sodium (GASTROGRAFIN) 66-10 % solution 90 mL (90 mLs Per NG tube Given 03/17/18 0310)  lactated ringers bolus 1,000 mL (0 mLs Intravenous Stopped 03/17/18 0214)  midazolam (VERSED) injection 0.5 mg (0.5 mg Intravenous Given 03/17/18 0031)  LORazepam (ATIVAN) injection 1 mg (1 mg Intravenous Given 03/17/18 0242)  lactated ringers bolus 1,000 mL (0 mLs Intravenous Stopped 03/17/18 0416)    Mobility walks with person assist

## 2018-03-17 NOTE — ED Notes (Signed)
Report received from [place], Paramedic

## 2018-03-18 ENCOUNTER — Inpatient Hospital Stay (HOSPITAL_COMMUNITY): Payer: Federal, State, Local not specified - PPO

## 2018-03-18 DIAGNOSIS — O119 Pre-existing hypertension with pre-eclampsia, unspecified trimester: Secondary | ICD-10-CM | POA: Diagnosis present

## 2018-03-18 MED ORDER — KETOROLAC TROMETHAMINE 15 MG/ML IJ SOLN
15.0000 mg | Freq: Four times a day (QID) | INTRAMUSCULAR | Status: DC | PRN
  Administered 2018-03-18 – 2018-03-19 (×3): 30 mg via INTRAVENOUS
  Filled 2018-03-18 (×3): qty 2

## 2018-03-18 MED ORDER — METOPROLOL TARTRATE 5 MG/5ML IV SOLN: 5.0000 mg | Freq: Four times a day (QID) | INTRAVENOUS | Status: DC

## 2018-03-18 MED ORDER — PROCHLORPERAZINE EDISYLATE 10 MG/2ML IJ SOLN: 5.0000 mg | INTRAMUSCULAR | Status: DC | PRN

## 2018-03-18 MED ORDER — LABETALOL HCL 5 MG/ML IV SOLN
40.0000 mg | INTRAVENOUS | Status: DC | PRN
  Filled 2018-03-18: qty 8

## 2018-03-18 MED ORDER — HYDRALAZINE HCL 20 MG/ML IJ SOLN: 10.0000 mg | INTRAMUSCULAR | Status: DC | PRN

## 2018-03-18 MED ORDER — METOCLOPRAMIDE HCL 5 MG/ML IJ SOLN: 10.0000 mg | Freq: Three times a day (TID) | INTRAMUSCULAR | Status: DC | PRN

## 2018-03-18 MED ORDER — LABETALOL HCL 100 MG PO TABS: 600.0000 mg | ORAL_TABLET | Freq: Three times a day (TID) | ORAL | Status: DC

## 2018-03-18 MED ORDER — HYDROMORPHONE HCL 1 MG/ML IJ SOLN: 0.5000 mg | INTRAMUSCULAR | Status: DC | PRN

## 2018-03-18 MED ORDER — ENOXAPARIN SODIUM 40 MG/0.4ML ~~LOC~~ SOLN
40.0000 mg | SUBCUTANEOUS | Status: DC
  Administered 2018-03-19: 40 mg via SUBCUTANEOUS
  Filled 2018-03-18: qty 0.4

## 2018-03-18 MED ORDER — SERTRALINE HCL 100 MG PO TABS
100.0000 mg | ORAL_TABLET | Freq: Every day | ORAL | Status: DC
  Administered 2018-03-18: 100 mg via ORAL
  Filled 2018-03-18: qty 1

## 2018-03-18 MED ORDER — LORAZEPAM 2 MG/ML IJ SOLN: 0.5000 mg | Freq: Three times a day (TID) | INTRAMUSCULAR | Status: DC | PRN

## 2018-03-18 MED ORDER — LABETALOL HCL 5 MG/ML IV SOLN
80.0000 mg | INTRAVENOUS | Status: DC | PRN
  Filled 2018-03-18: qty 16

## 2018-03-18 MED ORDER — METOPROLOL TARTRATE 5 MG/5ML IV SOLN: 5.0000 mg | Freq: Four times a day (QID) | INTRAVENOUS | Status: DC | PRN

## 2018-03-18 MED ORDER — LABETALOL HCL 5 MG/ML IV SOLN
20.0000 mg | INTRAVENOUS | Status: DC | PRN
  Filled 2018-03-18: qty 4

## 2018-03-18 NOTE — Progress Notes (Signed)
NGT flushed without difficulty, no residual via NGT. Will remove NGT per orders. Tawonna Esquer, [employer]

## 2018-03-18 NOTE — Progress Notes (Signed)
NGT removed per orders. Pt tolerated well, asking "is it all out alreadyDu [place], [employer]

## 2018-03-18 NOTE — Progress Notes (Signed)
Subjective/Chief Complaint:obstrucutuion  Pt with multiple BM   Wants to eat   No pain in abdomen    Objective: Vital signs in last 24 hours: Temp:  [98.1 F (36.7 C)-99 F (37.2 C)] 98.2 F (36.8 C) (11/08 2219) Pulse Rate:  [77-80] 80 (11/08 2219) Resp:  [16-18] 16 (11/08 2219) BP: (124-153)/(81-93) 139/88 (11/08 2219) SpO2:  [95 %-96 %] 96 % (11/08 2219) Last BM Date: 03/17/18  Intake/Output from previous day: 11/08 0701 - 11/09 0700 In: 2617.4 [P.O.:190; I.V.:2085.7; IV Piggyback:341.7] Out: 900 [Urine:250; Emesis/NG output:650] Intake/Output this shift: No intake/output data recorded.  General appearance: alert and cooperative Resp: clear to auscultation bilaterally GI: C section scar  soft NT   BS present   Lab Results:  Recent Labs    03/17/18 0411  WBC 8.6  HGB 10.4*  HCT 36.8  PLT 251   BMET Recent Labs    03/17/18 0411  NA 137  K 4.0  CL 102  CO2 27  GLUCOSE 89  BUN 11  CREATININE 0.86  CALCIUM 9.0   PT/INR No results for input(s): LABPROT, INR in the last 72 hours. ABG No results for input(s): PHART, HCO3 in the last 72 hours.  Invalid input(s): PCO2, PO2  Studies/Results: Dg Abd 1 View  Result Date: 03/17/2018 CLINICAL DATA:  NG tube placement EXAM: ABDOMEN - 1 VIEW COMPARISON:  None. FINDINGS: Enteric tube tip is in the left upper quadrant consistent with location in the upper stomach. Proximal side hole is below the expected location of the EG junction. Upper abdominal dilated gas-filled small bowel consistent with obstruction. Residual contrast material in the renal collecting systems. IMPRESSION: Enteric tube tip is in the left upper quadrant consistent with location in the upper stomach. Electronically Signed   By: Lucienne Capers M.D.   On: 03/17/2018 01:05   Ct Abdomen Pelvis W Contrast  Result Date: 03/16/2018 CLINICAL DATA:  Abdominal pain with nausea and vomiting for 4 days. Patient was diagnosed with norovirus yesterday by  primary care physician and was given medication but no improvement. EXAM: CT ABDOMEN AND PELVIS WITH CONTRAST TECHNIQUE: Multidetector CT imaging of the abdomen and pelvis was performed using the standard protocol following bolus administration of intravenous contrast. CONTRAST:  171mL ISOVUE-300 IOPAMIDOL (ISOVUE-300) INJECTION 61% COMPARISON:  None. FINDINGS: Lower chest: Lung bases are clear. Hepatobiliary: Multiple circumscribed cysts throughout the liver. Gallbladder and bile ducts are unremarkable. Small amount of free fluid around the liver. Pancreas: Unremarkable. No pancreatic ductal dilatation or surrounding inflammatory changes. Spleen: Normal in size without focal abnormality. Adrenals/Urinary Tract: Adrenal glands are unremarkable. Kidneys are normal, without renal calculi, focal lesion, or hydronephrosis. Bladder is unremarkable. Stomach/Bowel: There is a midline anterior low pelvic wall hernia containing fat and small bowel. There is proximal small bowel obstruction with dilated fluid-filled loops. Scattered air-fluid levels. Distal small bowel is decompressed. Scattered stool in the colon. Residual contrast material in the colon. Appendix is not identified. Vascular/Lymphatic: No significant vascular findings are present. No enlarged abdominal or pelvic lymph nodes. Reproductive: Uterus and bilateral adnexa are unremarkable. Other: Small amount of free fluid in the abdomen may represent ascites. No free air. Musculoskeletal: No acute or significant osseous findings. IMPRESSION: 1. Anterior low pelvic wall hernia containing fat and small bowel with proximal small bowel obstruction. 2. Small amount of free fluid in the abdomen and pelvis, likely ascites. 3. Multiple benign-appearing hepatic cysts. Electronically Signed   By: Lucienne Capers M.D.   On: 03/16/2018 23:09  Anti-infectives: Anti-infectives (From admission, onward)   Start     Dose/Rate Route Frequency Ordered Stop   03/17/18 0330   ciprofloxacin (CIPRO) IVPB 400 mg     400 mg 200 mL/hr over 60 Minutes Intravenous 2 times daily 03/17/18 0306 03/18/18 1959   03/16/18 2130  cefTRIAXone (ROCEPHIN) 1 g in sodium chloride 0.9 % 100 mL IVPB     1 g 200 mL/hr over 30 Minutes Intravenous  Once 03/16/18 2120 03/16/18 2322      Assessment/Plan:  UTI - continue IV abx Anxiety  Hx of cesarean section 02/07/18 Dr. 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

## 2018-03-18 NOTE — Progress Notes (Signed)
Checked with Dr Marcello Moores re: x-ray results. Order received. Zayde Stroupe, [employer]

## 2018-03-19 LAB — URINE CULTURE: Special Requests: NORMAL

## 2018-03-19 MED ORDER — SERTRALINE HCL 100 MG PO TABS
100.0000 mg | ORAL_TABLET | Freq: Every day | ORAL | Status: DC
  Filled 2018-03-19: qty 1

## 2018-03-19 NOTE — Progress Notes (Signed)
   Subjective/Chief Complaint: nausea  Pt with BM no abdominal pain Reviewed the CT multiple times  Not sure if change is true hernia or post op abdominal wall/ C section changes   Wants to eat   Objective: Vital signs in last 24 hours: Temp:  [98.4 F (36.9 C)] 98.4 F (36.9 C) (11/09 1401) Pulse Rate:  [81] 81 (11/09 1401) Resp:  [16] 16 (11/09 1401) BP: (156)/(96) 156/96 (11/09 1401) SpO2:  [99 %] 99 % (11/09 1401) Last BM Date: 03/18/18  Intake/Output from previous day: 11/09 0701 - 11/10 0700 In: 2402.5 [P.O.:300; I.V.:2102.5] Out: 600 [Emesis/NG output:600] Intake/Output this shift: No intake/output data recorded.  General appearance: alert and cooperative Resp: clear to auscultation bilaterally Incision/Wound:less tender   Less distended incision intact   Lab Results:  Recent Labs    03/17/18 0411  WBC 8.6  HGB 10.4*  HCT 36.8  PLT 251   BMET Recent Labs    03/17/18 0411  NA 137  K 4.0  CL 102  CO2 27  GLUCOSE 89  BUN 11  CREATININE 0.86  CALCIUM 9.0   PT/INR No results for input(s): LABPROT, INR in the last 72 hours. ABG No results for input(s): PHART, HCO3 in the last 72 hours.  Invalid input(s): PCO2, PO2  Studies/Results: Dg Abd 1 View  Result Date: 03/18/2018 CLINICAL DATA:  Multiple episodes of diarrhea. EXAM: ABDOMEN - 1 VIEW COMPARISON:  03/17/2018 FINDINGS: Nasogastric tube tip is in the fundus of the stomach. Previously administered oral contrast is now present throughout the colon. Mild prominence of the small bowel pattern but without definite obstruction at this time. No abnormal calcifications or bone finding. IMPRESSION: Less small bowel dilatation than seen previously. Previously administered contrast is entirely within the colon. Findings consistent with resolving or partial small bowel obstruction. Electronically Signed   By: Nelson Chimes M.D.   On: 03/18/2018 11:12    Anti-infectives: Anti-infectives (From admission,  onward)   Start     Dose/Rate Route Frequency Ordered Stop   03/17/18 0330  ciprofloxacin (CIPRO) IVPB 400 mg     400 mg 200 mL/hr over 60 Minutes Intravenous 2 times daily 03/17/18 0306 03/18/18 0912   03/16/18 2130  cefTRIAXone (ROCEPHIN) 1 g in sodium chloride 0.9 % 100 mL IVPB     1 g 200 mL/hr over 30 Minutes Intravenous  Once 03/16/18 2120 03/16/18 2322      Assessment/Plan: UTI- continue IV abx Anxiety  Hx of cesarean section 02/07/18 Dr. 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   Improved  Advance diet  Recommend outpatient follow up with Dr Johney Maine / Marlou Starks   And repeat CT in 2 - 3 weeks to reassess abdominal wall given recent C section  Home Monday if she tolerates diet   FEN:NPO, IVF, NGT to LIWS IDP:[id number] PN:[id number] 11/7, IV cipro 11/8>>  LOS: 1 day    Joyice Faster Alisi Lupien 03/18/2018   LOS: 2 days    Marcello Moores A Jonte Wollam 03/19/2018

## 2018-03-20 MED ORDER — ACETAMINOPHEN 500 MG PO TABS: 500.0000 mg | ORAL_TABLET | Freq: Four times a day (QID) | ORAL | Status: AC | PRN

## 2018-03-20 MED ORDER — DOCUSATE SODIUM 100 MG PO CAPS: 100.0000 mg | ORAL_CAPSULE | Freq: Every day | ORAL | 0 refills | Status: AC | PRN

## 2018-03-20 NOTE — Discharge Summary (Signed)
[facility] Discharge Summary   Patient ID: [name] MRN: [id number] DOB/AGE: 10/30/1970 47 y.o.  Admit date: 03/16/2018 Discharge date: 03/20/2018  Admitting Diagnosis: SBO UTI Recent cesarean section   Discharge Diagnosis Patient Active Problem List   Diagnosis Date Noted  . Chronic hypertension with superimposed pre-eclampsia 03/18/2018  . SBO (small bowel obstruction) ([place]) 03/17/2018  . UTI (urinary tract infection) 03/17/2018  . 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 Incarcerated incisional hernia 03/17/2018  . Obesity (BMI 30-39.9) 03/17/2018  . H/O cesarean section 02/07/2018  . Migraine with aura and without status migrainosus, not intractable 03/11/2017  . Bell's palsy 03/08/2017  . History of gestational diabetes 03/08/2017  . History of melanoma 03/08/2017  . Endometrial hyperplasia without atypia, simple 03/08/2017  . Anxiety 07/29/2015  . Palpitations 07/29/2015  . Hypothyroidism 07/29/2015    Consultants OB/GYN  Imaging: Dg Abd 1 View  Result Date: 03/18/2018 CLINICAL DATA:  Multiple episodes of diarrhea. EXAM: ABDOMEN - 1 VIEW COMPARISON:  03/17/2018 FINDINGS: Nasogastric tube tip is in the fundus of the stomach. Previously administered oral contrast is now present throughout the colon. Mild prominence of the small bowel pattern but without definite obstruction at this time. No abnormal calcifications or bone finding. IMPRESSION: Less small bowel dilatation than seen previously. Previously administered contrast is entirely within the colon. Findings consistent with resolving or partial small bowel obstruction. Electronically Signed   By: Nelson Chimes M.D.   On: 03/18/2018 11:12    Procedures None  Hospital Course:  Patient is a 47 year old female who presented to [facility] with nausea, vomiting and abdominal pain. Recent history of cesarean section 02/07/18.  Workup showed sbo with possible spigelian  hernia.  Patient was admitted and started on the small bowel protocol. Bowel function returned and  diet was advanced as tolerated.  On 03/20/18, the patient was voiding well, tolerating diet, ambulating well, pain well controlled, vital signs stable and felt stable for discharge home.  Patient will follow up in our office in 4-6 weeks and knows to call with questions or concerns. She will call to confirm appointment date/time.    Physical Exam: General:  Alert, NAD, pleasant, comfortable CV: RRR, distal pulses intact Resp: normal effort, CTAB Abd:  Soft, ND, non-tender, +BS, Pfannenstiel incision well healing  Allergies as of 03/20/2018      Reactions   Iron Anaphylaxis, Other (See Comments)   Iron infusion reaction   Acyclovir And Related Other (See Comments)   Unknown   Erythromycin Nausea And Vomiting   Augmentin [amoxicillin-pot Clavulanate] Nausea And Vomiting, Anxiety, Other (See Comments)   "panic attack"      Medication List    STOP taking these medications   oxyCODONE-acetaminophen 5-325 MG tablet Commonly known as:  PERCOCET/ROXICET     TAKE these medications   acetaminophen 500 MG tablet Commonly known as:  TYLENOL Take 1 tablet (500 mg total) by mouth every 6 (six) hours as needed for mild pain.   docusate sodium 100 MG capsule Commonly known as:  COLACE Take 1 capsule (100 mg total) by mouth daily as needed for mild constipation.   ibuprofen 600 MG tablet Commonly known as:  ADVIL,MOTRIN Take 1 tablet (600 mg total) by mouth every 6 (six) hours as needed.   levothyroxine 50 MCG tablet Commonly known as:  SYNTHROID, LEVOTHROID Take 50 mcg by mouth daily.   oxymetazoline 0.05 % nasal spray Commonly known as:  AFRIN Place 1 spray  into both nostrils 2 (two) times daily as needed for congestion.   prenatal multivitamin Tabs tablet Take 1 tablet by mouth daily.   sertraline 100 MG tablet Commonly known as:  ZOLOFT Take 100 mg by mouth daily.         Follow-up Information    Michael Boston, MD. Go on 04/17/2018.   Specialty:  General Surgery Why:  Appointment scheduled for 10:15 AM. Please arrive 30 min prior to appointment time. Bring photo ID and insurance information.  Contact information: [address] [place] [address] [phone]           Signed: Brigid Re, [facility] Surgery 03/20/2018, 8:57 AM Pager: [phone] Consults: [phone] Mon-Fri 7:00 am-4:30 pm Sat-Sun 7:00 am-11:30 am

## 2018-03-20 NOTE — Discharge Instructions (Signed)
Follow up in the General surgery office in 1 month. You will need repeat CT scan prior to this appointment. Follow up with OB/GYN as planned.    Small Bowel Obstruction A small bowel obstruction means that something is blocking the small bowel. The small bowel is also called the small intestine. It is the long tube that connects the stomach to the colon. An obstruction will stop food and fluids from passing through the small bowel. Treatment depends on what is causing the problem and how bad the problem is. Follow these instructions at home:  Get a lot of rest.  Follow your diet as told by your doctor. You may need to: ? Only drink clear liquids until you start to get better. ? Avoid solid foods as told by your doctor.  Take over-the-counter and prescription medicines only as told by your doctor.  Keep all follow-up visits as told by your doctor. This is important. Contact a doctor if:  You have a fever.  You have chills. Get help right away if:  You have pain or cramps that get worse.  You throw up (vomit) blood.  You have a feeling of being sick to your stomach (nausea) that does not go away.  You cannot stop throwing up.  You cannot drink fluids.  You feel confused.  You feel dry or thirsty (dehydrated).  Your belly gets more bloated.  You feel weak or you pass out (faint). This information is not intended to replace advice given to you by your health care provider. Make sure you discuss any questions you have with your health care provider. Document Released: 06/03/2004 Document Revised: 12/22/2015 Document Reviewed: 06/20/2014 Elsevier Interactive Patient Education  [employer].

## 2018-03-21 DIAGNOSIS — D509 Iron deficiency anemia, unspecified: Secondary | ICD-10-CM | POA: Diagnosis not present

## 2018-03-21 DIAGNOSIS — Z1389 Encounter for screening for other disorder: Secondary | ICD-10-CM | POA: Diagnosis not present

## 2018-04-13 DIAGNOSIS — F419 Anxiety disorder, unspecified: Secondary | ICD-10-CM | POA: Diagnosis not present

## 2018-04-13 DIAGNOSIS — E039 Hypothyroidism, unspecified: Secondary | ICD-10-CM | POA: Diagnosis not present

## 2018-04-13 DIAGNOSIS — R030 Elevated blood-pressure reading, without diagnosis of hypertension: Secondary | ICD-10-CM | POA: Diagnosis not present

## 2018-04-17 DIAGNOSIS — K56609 Unspecified intestinal obstruction, unspecified as to partial versus complete obstruction: Secondary | ICD-10-CM | POA: Diagnosis not present

## 2018-06-26 DIAGNOSIS — L659 Nonscarring hair loss, unspecified: Secondary | ICD-10-CM | POA: Diagnosis not present

## 2018-07-12 DIAGNOSIS — N912 Amenorrhea, unspecified: Secondary | ICD-10-CM | POA: Diagnosis not present

## 2018-07-12 DIAGNOSIS — J069 Acute upper respiratory infection, unspecified: Secondary | ICD-10-CM | POA: Diagnosis not present

## 2018-10-03 DIAGNOSIS — F419 Anxiety disorder, unspecified: Secondary | ICD-10-CM | POA: Diagnosis not present

## 2018-11-22 DIAGNOSIS — Z85820 Personal history of malignant melanoma of skin: Secondary | ICD-10-CM | POA: Diagnosis not present

## 2018-11-22 DIAGNOSIS — L814 Other melanin hyperpigmentation: Secondary | ICD-10-CM | POA: Diagnosis not present

## 2018-11-22 DIAGNOSIS — Z86018 Personal history of other benign neoplasm: Secondary | ICD-10-CM | POA: Diagnosis not present

## 2018-11-22 DIAGNOSIS — D225 Melanocytic nevi of trunk: Secondary | ICD-10-CM | POA: Diagnosis not present

## 2018-12-26 DIAGNOSIS — Z20828 Contact with and (suspected) exposure to other viral communicable diseases: Secondary | ICD-10-CM | POA: Diagnosis not present

## 2019-03-21 DIAGNOSIS — R300 Dysuria: Secondary | ICD-10-CM | POA: Diagnosis not present

## 2019-03-23 ENCOUNTER — Other Ambulatory Visit: Payer: Self-pay

## 2019-03-23 DIAGNOSIS — Z20822 Contact with and (suspected) exposure to covid-19: Secondary | ICD-10-CM

## 2019-03-26 DIAGNOSIS — D485 Neoplasm of uncertain behavior of skin: Secondary | ICD-10-CM | POA: Diagnosis not present

## 2019-03-26 DIAGNOSIS — L309 Dermatitis, unspecified: Secondary | ICD-10-CM | POA: Diagnosis not present

## 2019-03-26 LAB — NOVEL CORONAVIRUS, NAA: SARS-CoV-2, NAA: NOT DETECTED

## 2019-03-28 ENCOUNTER — Telehealth: Payer: Self-pay | Admitting: *Deleted

## 2019-03-28 NOTE — Telephone Encounter (Signed)
Patient called and was given NEGATIVE COVID results . 

## 2019-04-13 IMAGING — DX DG ABDOMEN 1V
1 series · 1 of 1 positions shown · non-contrast
Comparison: None.

CLINICAL DATA: NG tube placement

EXAM:
ABDOMEN - 1 VIEW

[abdomen kub]
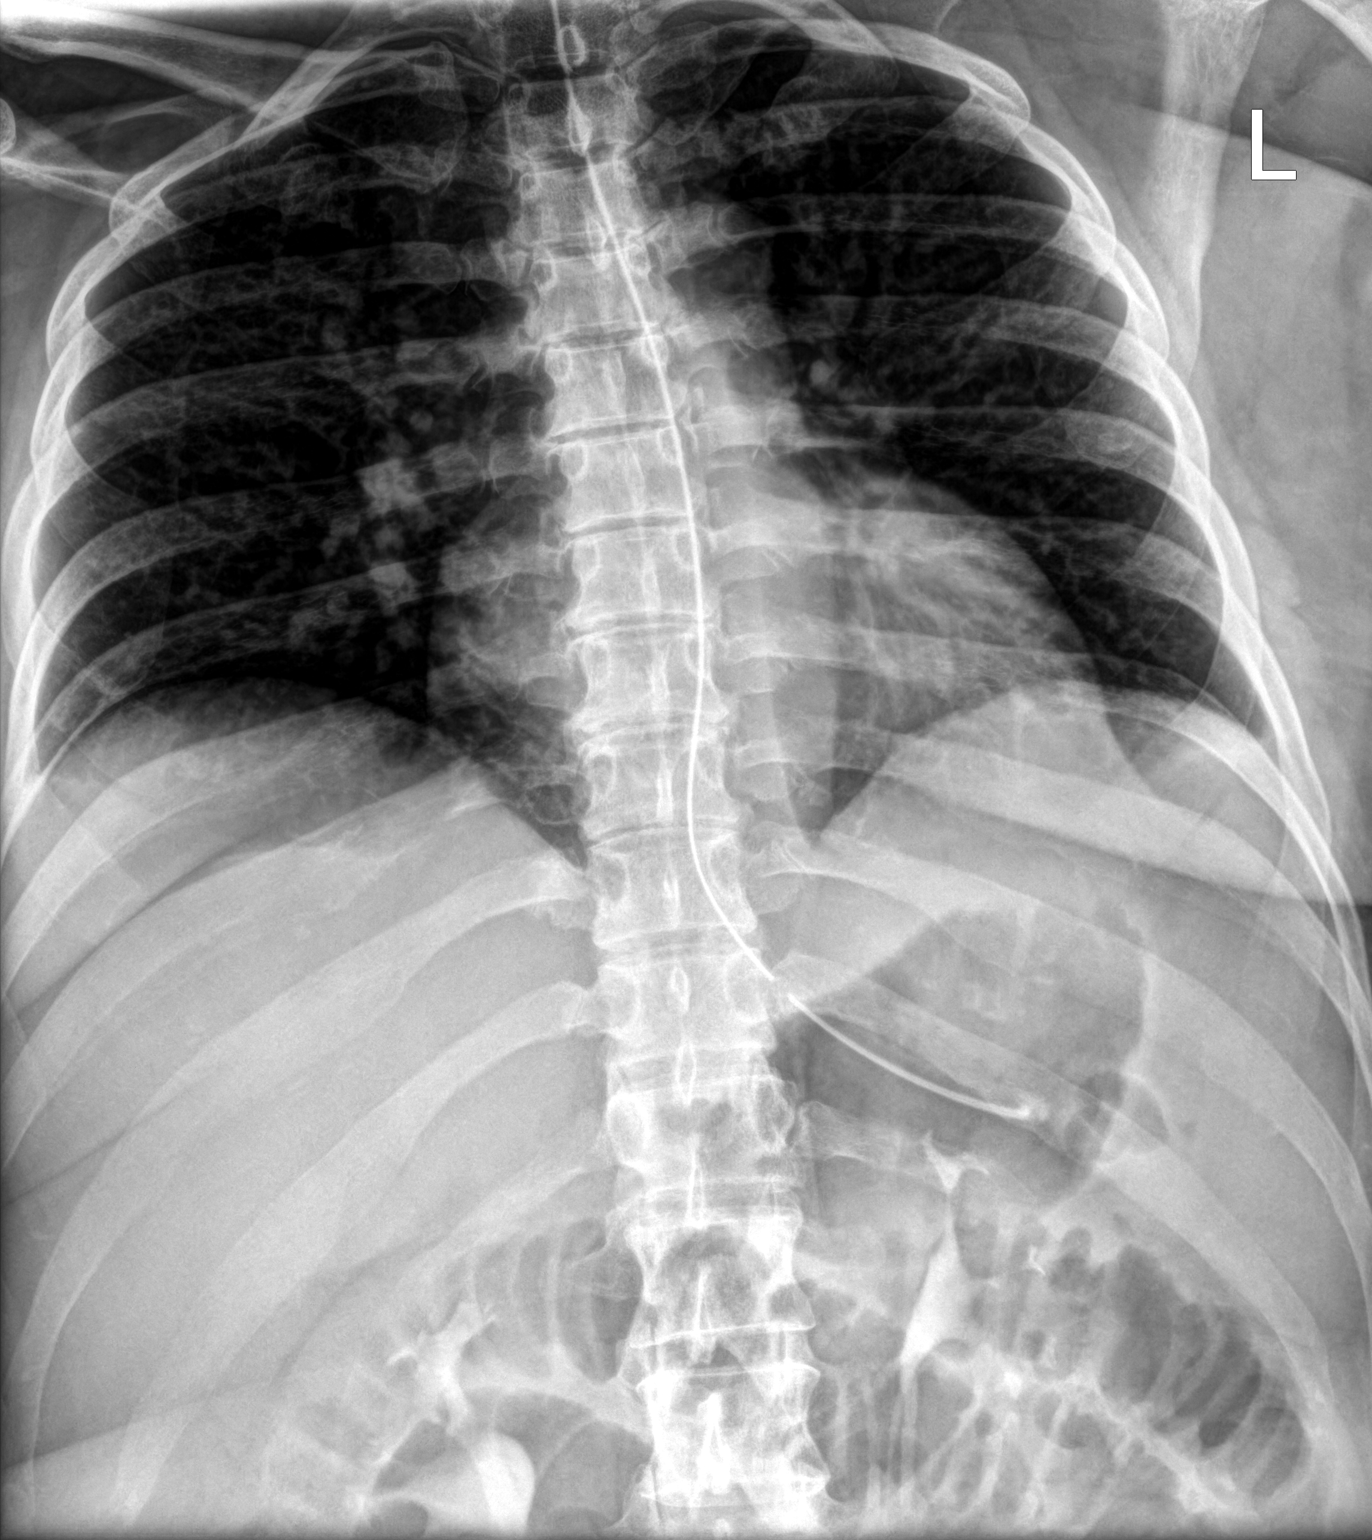

[1 of 1 positions shown; findings below may reference images not displayed]

FINDINGS: Enteric tube tip is in the left upper quadrant consistent with
location in the upper stomach. Proximal side hole is below the
expected location of the EG junction. Upper abdominal dilated
gas-filled small bowel consistent with obstruction. Residual
contrast material in the renal collecting systems.
IMPRESSION: Enteric tube tip is in the left upper quadrant consistent with
location in the upper stomach.

## 2019-04-14 IMAGING — DX DG ABDOMEN 1V
2 series · 2 of 2 positions shown · non-contrast
Comparison: 03/17/2018

CLINICAL DATA: Multiple episodes of diarrhea.

EXAM:
ABDOMEN - 1 VIEW

[abdomen kub (1 of 2)]
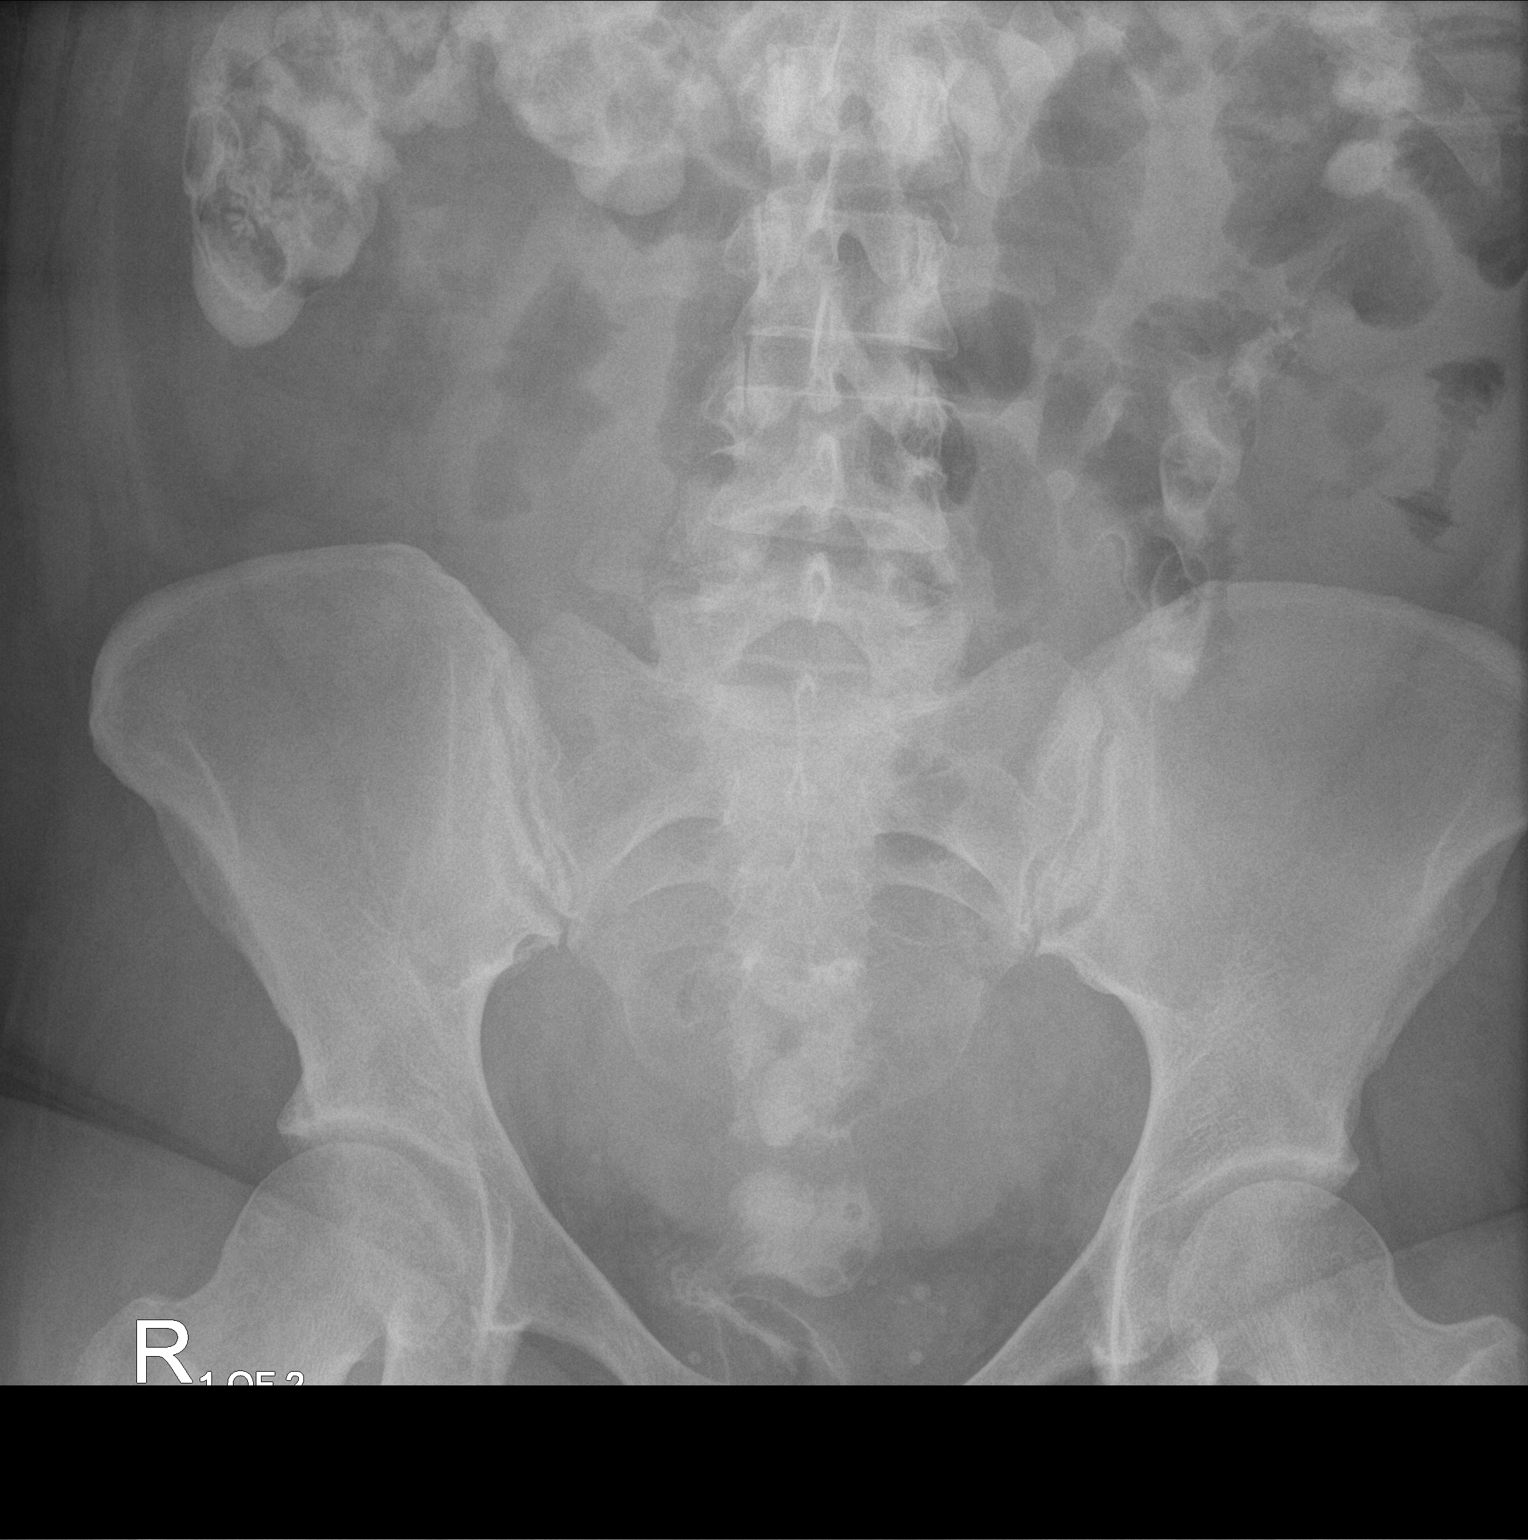

[abdomen kub (2 of 2)]
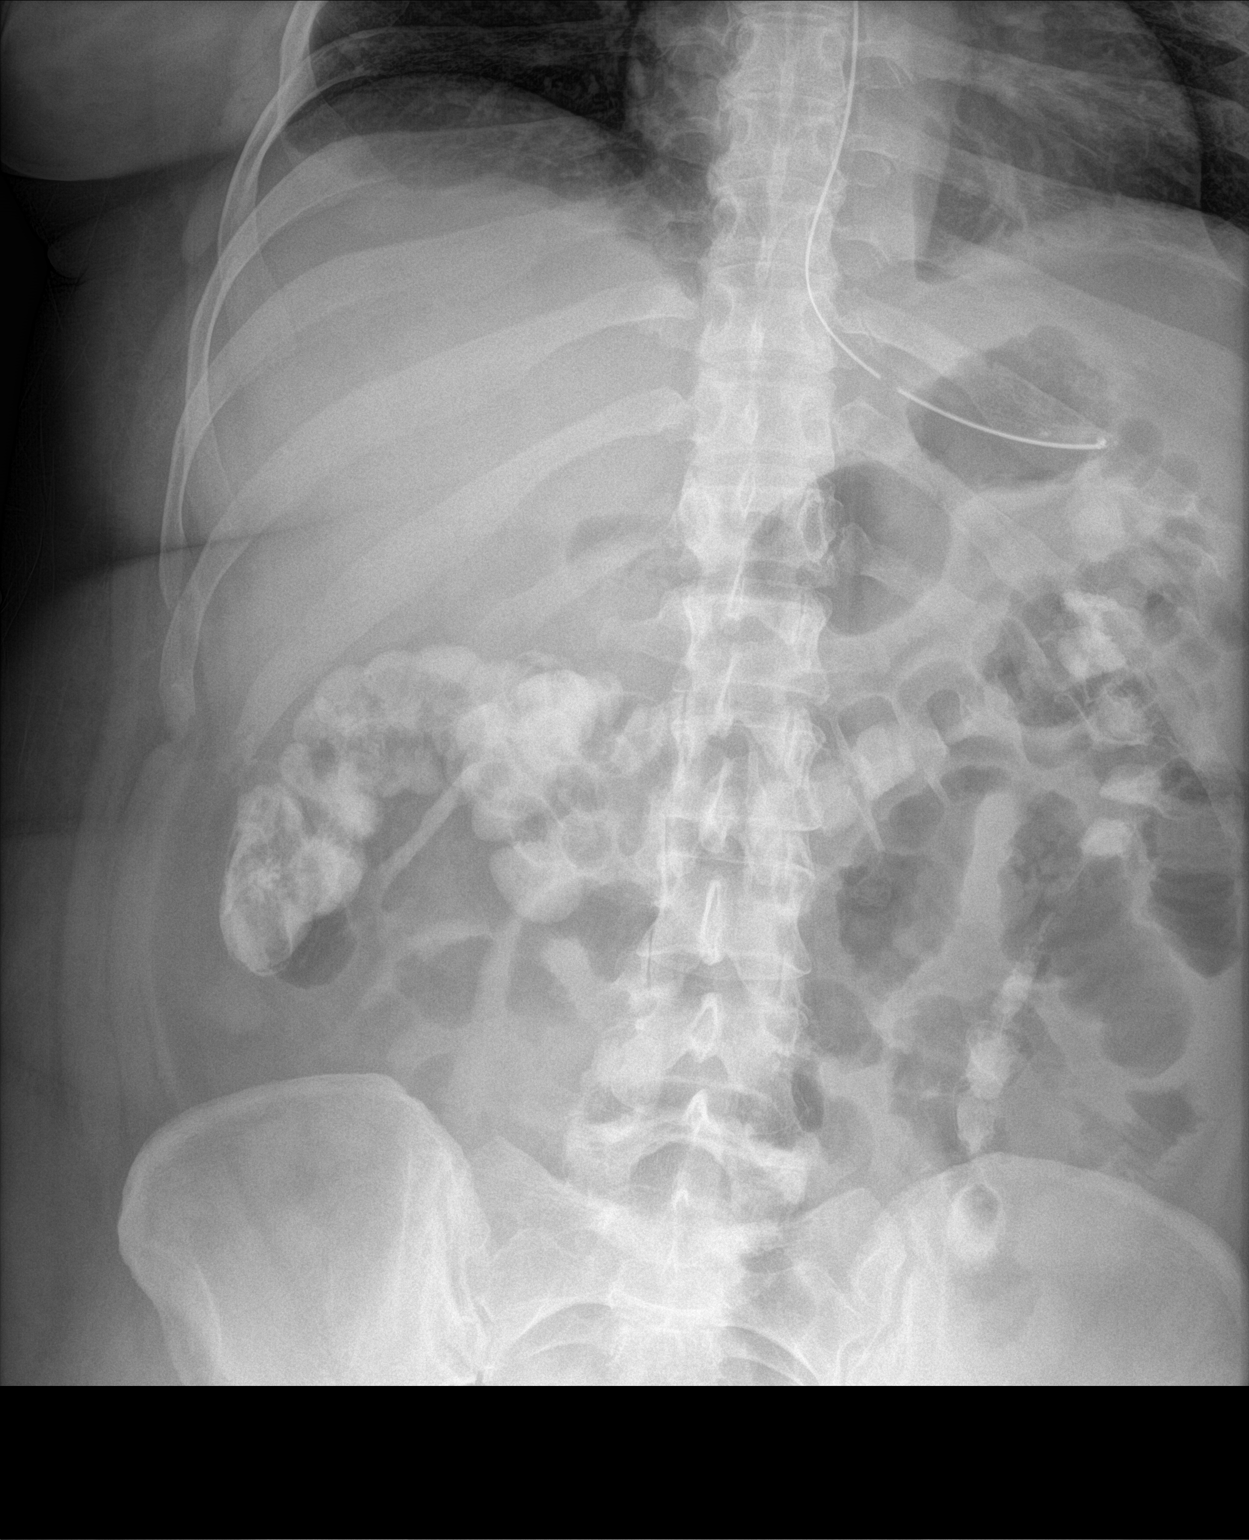

[2 of 2 positions shown; findings below may reference images not displayed]

FINDINGS: Nasogastric tube tip is in the fundus of the stomach. Previously
administered oral contrast is now present throughout the colon. Mild
prominence of the small bowel pattern but without definite
obstruction at this time. No abnormal calcifications or bone
finding.
IMPRESSION: Less small bowel dilatation than seen previously. Previously
administered contrast is entirely within the colon. Findings
consistent with resolving or partial small bowel obstruction.

## 2019-05-18 DIAGNOSIS — Z13228 Encounter for screening for other metabolic disorders: Secondary | ICD-10-CM | POA: Diagnosis not present

## 2019-05-18 DIAGNOSIS — Z1231 Encounter for screening mammogram for malignant neoplasm of breast: Secondary | ICD-10-CM | POA: Diagnosis not present

## 2019-05-18 DIAGNOSIS — Z131 Encounter for screening for diabetes mellitus: Secondary | ICD-10-CM | POA: Diagnosis not present

## 2019-05-18 DIAGNOSIS — Z6841 Body Mass Index (BMI) 40.0 and over, adult: Secondary | ICD-10-CM | POA: Diagnosis not present

## 2019-05-18 DIAGNOSIS — Z13220 Encounter for screening for lipoid disorders: Secondary | ICD-10-CM | POA: Diagnosis not present

## 2019-05-18 DIAGNOSIS — Z01419 Encounter for gynecological examination (general) (routine) without abnormal findings: Secondary | ICD-10-CM | POA: Diagnosis not present

## 2019-05-18 DIAGNOSIS — Z1329 Encounter for screening for other suspected endocrine disorder: Secondary | ICD-10-CM | POA: Diagnosis not present

## 2019-06-01 DIAGNOSIS — Z03818 Encounter for observation for suspected exposure to other biological agents ruled out: Secondary | ICD-10-CM | POA: Diagnosis not present

## 2019-06-01 DIAGNOSIS — Z20828 Contact with and (suspected) exposure to other viral communicable diseases: Secondary | ICD-10-CM | POA: Diagnosis not present

## 2019-06-06 DIAGNOSIS — Z20828 Contact with and (suspected) exposure to other viral communicable diseases: Secondary | ICD-10-CM | POA: Diagnosis not present

## 2019-06-06 DIAGNOSIS — Z03818 Encounter for observation for suspected exposure to other biological agents ruled out: Secondary | ICD-10-CM | POA: Diagnosis not present

## 2019-07-23 DIAGNOSIS — E039 Hypothyroidism, unspecified: Secondary | ICD-10-CM | POA: Diagnosis not present

## 2019-07-23 DIAGNOSIS — F419 Anxiety disorder, unspecified: Secondary | ICD-10-CM | POA: Diagnosis not present

## 2019-07-23 DIAGNOSIS — E559 Vitamin D deficiency, unspecified: Secondary | ICD-10-CM | POA: Diagnosis not present

## 2019-07-23 DIAGNOSIS — R609 Edema, unspecified: Secondary | ICD-10-CM | POA: Diagnosis not present

## 2019-09-13 DIAGNOSIS — F439 Reaction to severe stress, unspecified: Secondary | ICD-10-CM | POA: Diagnosis not present

## 2019-09-13 DIAGNOSIS — J069 Acute upper respiratory infection, unspecified: Secondary | ICD-10-CM | POA: Diagnosis not present

## 2019-09-13 DIAGNOSIS — F419 Anxiety disorder, unspecified: Secondary | ICD-10-CM | POA: Diagnosis not present

## 2019-12-12 DIAGNOSIS — D2261 Melanocytic nevi of right upper limb, including shoulder: Secondary | ICD-10-CM | POA: Diagnosis not present

## 2019-12-12 DIAGNOSIS — Z85820 Personal history of malignant melanoma of skin: Secondary | ICD-10-CM | POA: Diagnosis not present

## 2019-12-12 DIAGNOSIS — L814 Other melanin hyperpigmentation: Secondary | ICD-10-CM | POA: Diagnosis not present

## 2019-12-12 DIAGNOSIS — D485 Neoplasm of uncertain behavior of skin: Secondary | ICD-10-CM | POA: Diagnosis not present

## 2019-12-12 DIAGNOSIS — Z86018 Personal history of other benign neoplasm: Secondary | ICD-10-CM | POA: Diagnosis not present

## 2019-12-12 DIAGNOSIS — D225 Melanocytic nevi of trunk: Secondary | ICD-10-CM | POA: Diagnosis not present

## 2020-05-20 DIAGNOSIS — Z1152 Encounter for screening for COVID-19: Secondary | ICD-10-CM | POA: Diagnosis not present

## 2020-06-05 DIAGNOSIS — Z20822 Contact with and (suspected) exposure to covid-19: Secondary | ICD-10-CM | POA: Diagnosis not present

## 2020-06-18 DIAGNOSIS — J019 Acute sinusitis, unspecified: Secondary | ICD-10-CM | POA: Diagnosis not present

## 2020-07-10 DIAGNOSIS — E559 Vitamin D deficiency, unspecified: Secondary | ICD-10-CM | POA: Diagnosis not present

## 2020-07-10 DIAGNOSIS — F419 Anxiety disorder, unspecified: Secondary | ICD-10-CM | POA: Diagnosis not present

## 2020-07-10 DIAGNOSIS — Z8632 Personal history of gestational diabetes: Secondary | ICD-10-CM | POA: Diagnosis not present

## 2020-07-10 DIAGNOSIS — E039 Hypothyroidism, unspecified: Secondary | ICD-10-CM | POA: Diagnosis not present

## 2020-11-13 DIAGNOSIS — R319 Hematuria, unspecified: Secondary | ICD-10-CM | POA: Diagnosis not present

## 2020-11-13 DIAGNOSIS — Z1231 Encounter for screening mammogram for malignant neoplasm of breast: Secondary | ICD-10-CM | POA: Diagnosis not present

## 2020-11-13 DIAGNOSIS — N952 Postmenopausal atrophic vaginitis: Secondary | ICD-10-CM | POA: Diagnosis not present

## 2020-11-13 DIAGNOSIS — Z7689 Persons encountering health services in other specified circumstances: Secondary | ICD-10-CM | POA: Diagnosis not present

## 2020-11-13 DIAGNOSIS — N760 Acute vaginitis: Secondary | ICD-10-CM | POA: Diagnosis not present

## 2020-11-13 DIAGNOSIS — Z1329 Encounter for screening for other suspected endocrine disorder: Secondary | ICD-10-CM | POA: Diagnosis not present

## 2020-11-13 DIAGNOSIS — Z01419 Encounter for gynecological examination (general) (routine) without abnormal findings: Secondary | ICD-10-CM | POA: Diagnosis not present

## 2020-11-13 DIAGNOSIS — R7309 Other abnormal glucose: Secondary | ICD-10-CM | POA: Diagnosis not present

## 2020-11-13 DIAGNOSIS — E559 Vitamin D deficiency, unspecified: Secondary | ICD-10-CM | POA: Diagnosis not present

## 2020-11-13 DIAGNOSIS — E782 Mixed hyperlipidemia: Secondary | ICD-10-CM | POA: Diagnosis not present

## 2020-12-24 DIAGNOSIS — L814 Other melanin hyperpigmentation: Secondary | ICD-10-CM | POA: Diagnosis not present

## 2020-12-24 DIAGNOSIS — D225 Melanocytic nevi of trunk: Secondary | ICD-10-CM | POA: Diagnosis not present

## 2020-12-24 DIAGNOSIS — Z86018 Personal history of other benign neoplasm: Secondary | ICD-10-CM | POA: Diagnosis not present

## 2020-12-24 DIAGNOSIS — Z85820 Personal history of malignant melanoma of skin: Secondary | ICD-10-CM | POA: Diagnosis not present

## 2020-12-31 DIAGNOSIS — Z803 Family history of malignant neoplasm of breast: Secondary | ICD-10-CM | POA: Diagnosis not present

## 2020-12-31 DIAGNOSIS — Z9189 Other specified personal risk factors, not elsewhere classified: Secondary | ICD-10-CM | POA: Diagnosis not present

## 2020-12-31 DIAGNOSIS — Z800 Family history of malignant neoplasm of digestive organs: Secondary | ICD-10-CM | POA: Diagnosis not present

## 2021-01-02 ENCOUNTER — Other Ambulatory Visit: Payer: Self-pay | Admitting: Obstetrics and Gynecology

## 2021-01-02 DIAGNOSIS — Z9189 Other specified personal risk factors, not elsewhere classified: Secondary | ICD-10-CM

## 2021-02-04 ENCOUNTER — Other Ambulatory Visit: Payer: Self-pay

## 2021-02-04 ENCOUNTER — Ambulatory Visit
Admission: RE | Admit: 2021-02-04 | Discharge: 2021-02-04 | Disposition: A | Discharge status: Home / Self Care | Payer: No Typology Code available for payment source | Source: Ambulatory Visit | Attending: Obstetrics and Gynecology | Admitting: Obstetrics and Gynecology

## 2021-02-04 DIAGNOSIS — Z9189 Other specified personal risk factors, not elsewhere classified: Secondary | ICD-10-CM

## 2021-02-04 MED ORDER — GADOBUTROL 1 MMOL/ML IV SOLN
10.0000 mL | Freq: Once | INTRAVENOUS | Status: AC | PRN
  Administered 2021-02-04: 10 mL via INTRAVENOUS

## 2021-02-05 DIAGNOSIS — K080 Exfoliation of teeth due to systemic causes: Secondary | ICD-10-CM | POA: Diagnosis not present

## 2021-02-26 DIAGNOSIS — R059 Cough, unspecified: Secondary | ICD-10-CM | POA: Diagnosis not present

## 2021-02-26 DIAGNOSIS — J069 Acute upper respiratory infection, unspecified: Secondary | ICD-10-CM | POA: Diagnosis not present

## 2021-07-11 DIAGNOSIS — J038 Acute tonsillitis due to other specified organisms: Secondary | ICD-10-CM | POA: Diagnosis not present

## 2021-08-12 DIAGNOSIS — Z8371 Family history of colonic polyps: Secondary | ICD-10-CM | POA: Diagnosis not present

## 2021-08-12 DIAGNOSIS — Z1211 Encounter for screening for malignant neoplasm of colon: Secondary | ICD-10-CM | POA: Diagnosis not present

## 2021-08-12 DIAGNOSIS — K648 Other hemorrhoids: Secondary | ICD-10-CM | POA: Diagnosis not present

## 2021-08-17 DIAGNOSIS — R110 Nausea: Secondary | ICD-10-CM | POA: Diagnosis not present

## 2021-08-17 DIAGNOSIS — R21 Rash and other nonspecific skin eruption: Secondary | ICD-10-CM | POA: Diagnosis not present

## 2021-08-17 DIAGNOSIS — R109 Unspecified abdominal pain: Secondary | ICD-10-CM | POA: Diagnosis not present

## 2021-09-20 DIAGNOSIS — N390 Urinary tract infection, site not specified: Secondary | ICD-10-CM | POA: Diagnosis not present

## 2021-12-04 DIAGNOSIS — R030 Elevated blood-pressure reading, without diagnosis of hypertension: Secondary | ICD-10-CM | POA: Diagnosis not present

## 2021-12-04 DIAGNOSIS — Z8619 Personal history of other infectious and parasitic diseases: Secondary | ICD-10-CM | POA: Diagnosis not present

## 2021-12-04 DIAGNOSIS — J019 Acute sinusitis, unspecified: Secondary | ICD-10-CM | POA: Diagnosis not present

## 2021-12-04 DIAGNOSIS — R059 Cough, unspecified: Secondary | ICD-10-CM | POA: Diagnosis not present

## 2021-12-30 DIAGNOSIS — L578 Other skin changes due to chronic exposure to nonionizing radiation: Secondary | ICD-10-CM | POA: Diagnosis not present

## 2021-12-30 DIAGNOSIS — D225 Melanocytic nevi of trunk: Secondary | ICD-10-CM | POA: Diagnosis not present

## 2021-12-30 DIAGNOSIS — L821 Other seborrheic keratosis: Secondary | ICD-10-CM | POA: Diagnosis not present

## 2021-12-30 DIAGNOSIS — L814 Other melanin hyperpigmentation: Secondary | ICD-10-CM | POA: Diagnosis not present

## 2022-01-13 DIAGNOSIS — Z1159 Encounter for screening for other viral diseases: Secondary | ICD-10-CM | POA: Diagnosis not present

## 2022-01-13 DIAGNOSIS — E559 Vitamin D deficiency, unspecified: Secondary | ICD-10-CM | POA: Diagnosis not present

## 2022-01-13 DIAGNOSIS — E039 Hypothyroidism, unspecified: Secondary | ICD-10-CM | POA: Diagnosis not present

## 2022-01-13 DIAGNOSIS — F419 Anxiety disorder, unspecified: Secondary | ICD-10-CM | POA: Diagnosis not present

## 2022-01-13 DIAGNOSIS — Z8632 Personal history of gestational diabetes: Secondary | ICD-10-CM | POA: Diagnosis not present

## 2022-01-13 DIAGNOSIS — D509 Iron deficiency anemia, unspecified: Secondary | ICD-10-CM | POA: Diagnosis not present

## 2022-01-13 DIAGNOSIS — R7303 Prediabetes: Secondary | ICD-10-CM | POA: Diagnosis not present

## 2022-01-21 DIAGNOSIS — J029 Acute pharyngitis, unspecified: Secondary | ICD-10-CM | POA: Diagnosis not present

## 2022-01-21 DIAGNOSIS — U071 COVID-19: Secondary | ICD-10-CM | POA: Diagnosis not present

## 2022-02-10 DIAGNOSIS — E559 Vitamin D deficiency, unspecified: Secondary | ICD-10-CM | POA: Diagnosis not present

## 2022-02-10 DIAGNOSIS — Z6841 Body Mass Index (BMI) 40.0 and over, adult: Secondary | ICD-10-CM | POA: Diagnosis not present

## 2022-02-10 DIAGNOSIS — E039 Hypothyroidism, unspecified: Secondary | ICD-10-CM | POA: Diagnosis not present

## 2022-03-03 IMAGING — MR MR BREAST WO/W CM  BILAT
5 series · 30 of 48 positions shown · IV contrast (gadavist)
Comparison: Screening mammogram on 11/13/2020 have a CT of the
abdomen and pelvis on 03/16/2018

CLINICAL DATA: Abbreviated Breast MRI for breast cancer screening.
Strong family history of breast cancer. Patient's maternal
grandmother was diagnosed at age 70, her maternal great grandmother
at age 65, her paternal grandmother at age 60. Patient is positive
for the BRCA gene mutation.

LABS:  None obtained at the time of imaging.
EXAM:
BILATERAL ABBREVIATED BREAST MRI WITH AND WITHOUT CONTRAST
TECHNIQUE: Multiplanar, multisequence MR images of both breasts were obtained
prior to and following the intravenous administration of 10 ml of
Gadavist

[Series 2: t2_tirm_tra ipat (a-p) · axial · 3.0mm · 0.70mm/px · z∈[-117,+45]mm · 5 of 55 slices shown]
[im 1/55]
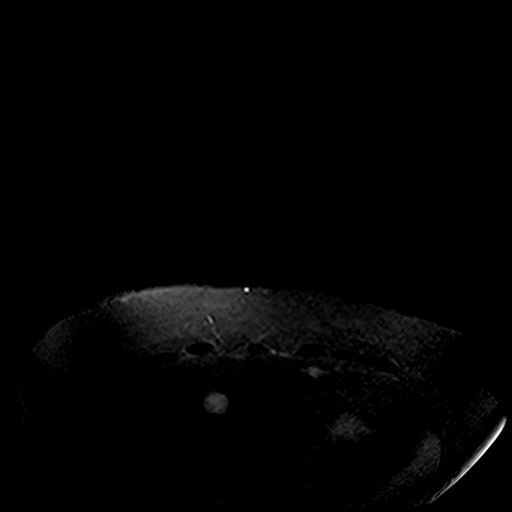
[im 14/55]
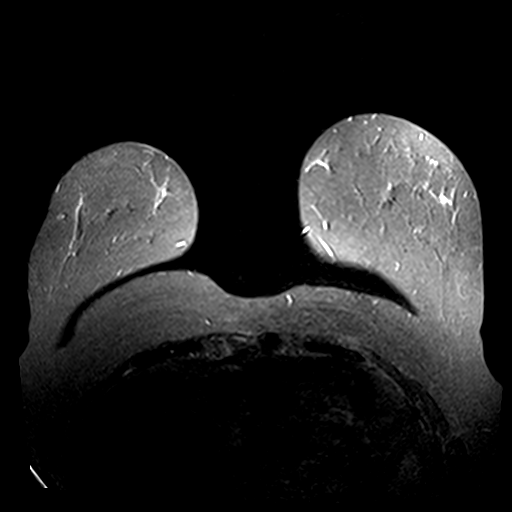
[im 28/55]
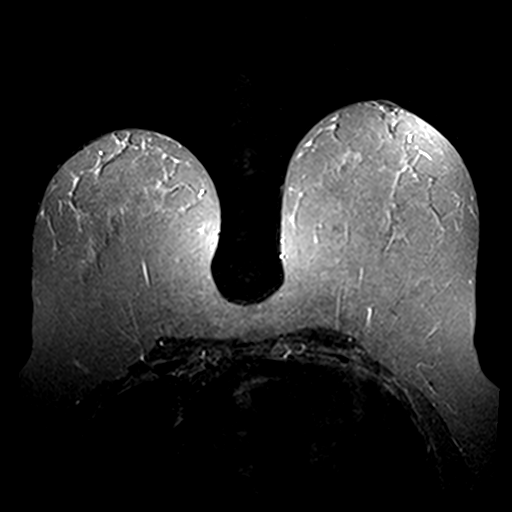
[im 41/55]
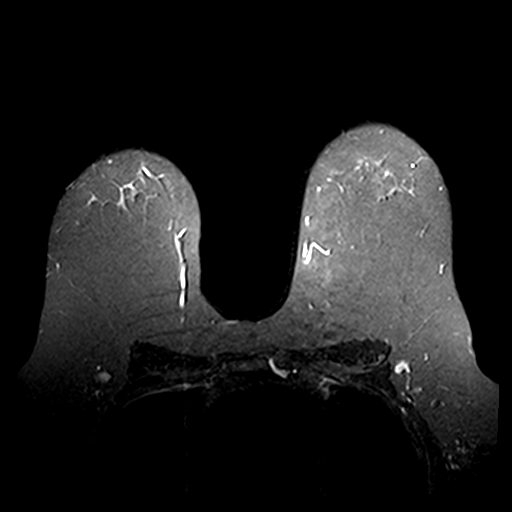
[im 55/55]
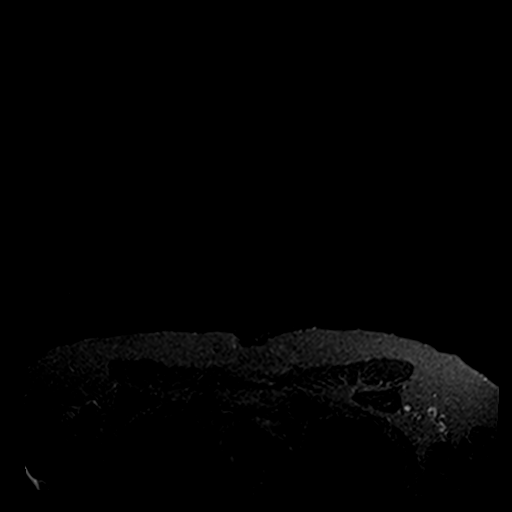

[Series 3: fl3d pre-cm · axial · non-contrast · 1.2mm · 0.94mm/px · z∈[-121,+50]mm · 8 of 144 slices shown]
[im 1/144]
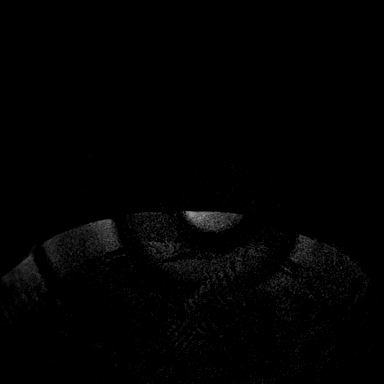
[im 23/144]
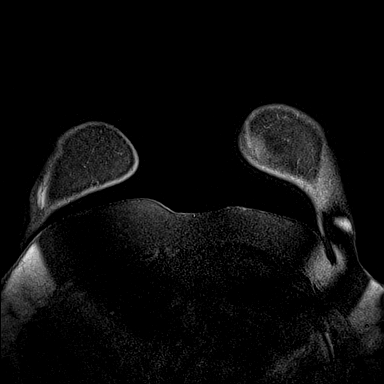
[im 45/144]
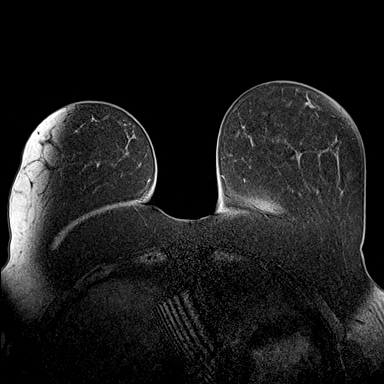
[im 67/144]
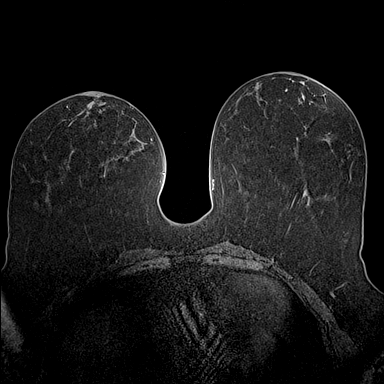
[im 78/144]
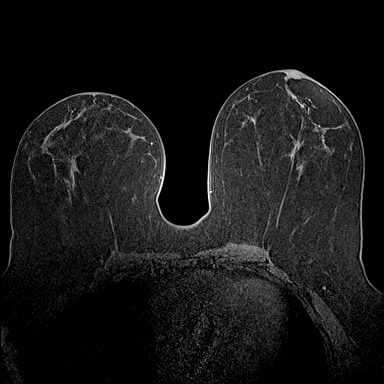
[im 100/144]
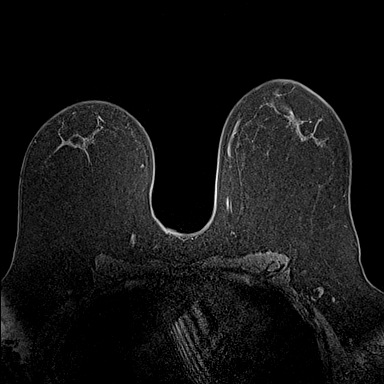
[im 122/144]
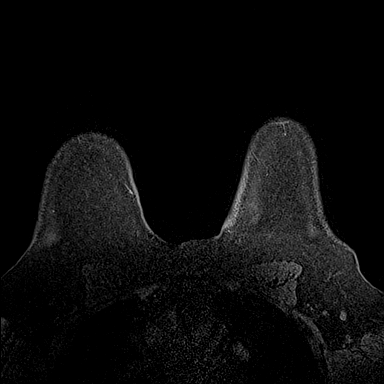
[im 144/144]
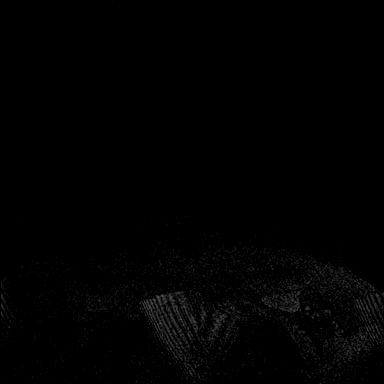

[Series 4: fl3d post-cm 20 · axial · 1.2mm · 0.94mm/px · z∈[-121,+50]mm · 8 of 144 slices shown (1 of 3)]
[im 1/144]
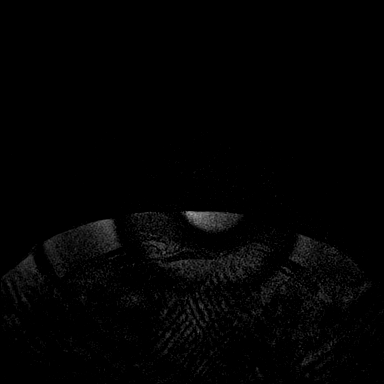
[im 23/144]
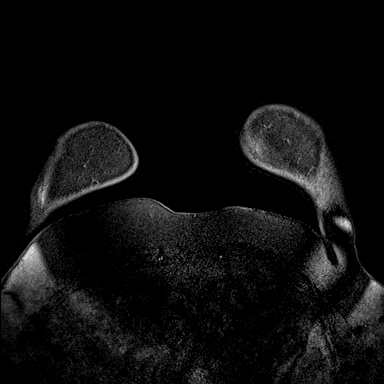
[im 45/144]
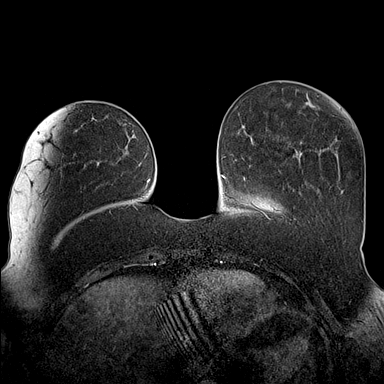
[im 67/144]
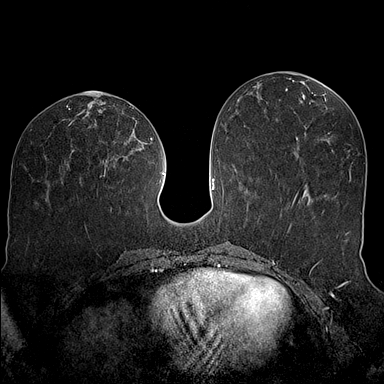
[im 78/144]
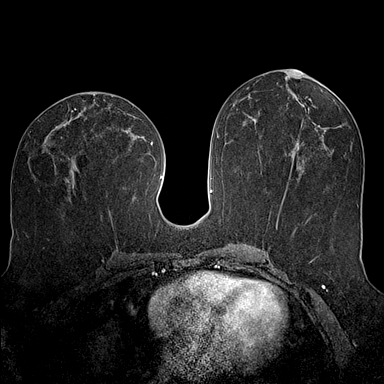
[im 100/144]
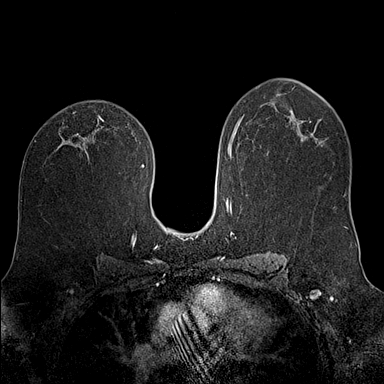
[im 122/144]
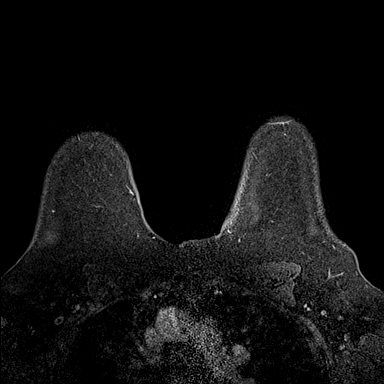
[im 144/144]
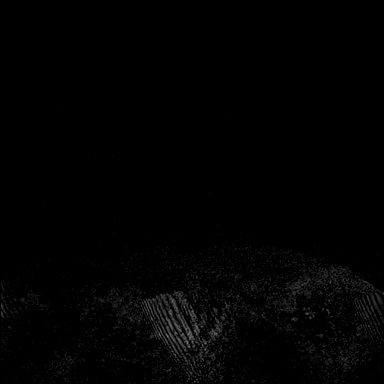

[Series 5: fl3d post-cm 20 · axial · 1.2mm · 0.94mm/px · z∈[-121,+50]mm · 8 of 144 slices shown (2 of 3)]
[im 1/144]
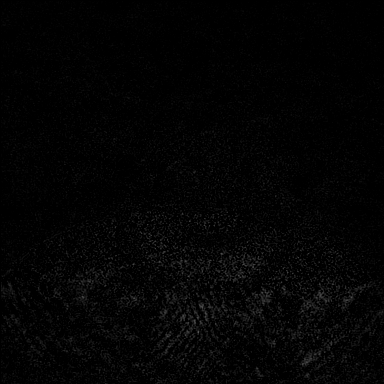
[im 23/144]
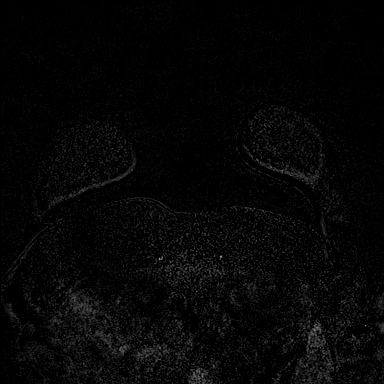
[im 45/144]
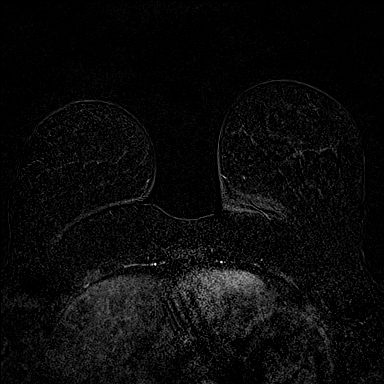
[im 67/144]
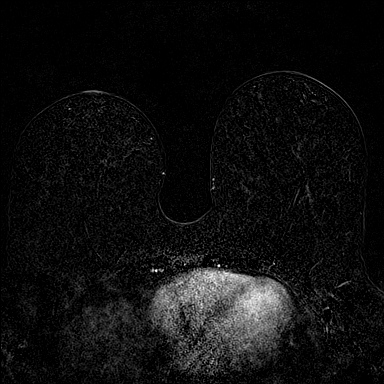
[im 78/144]
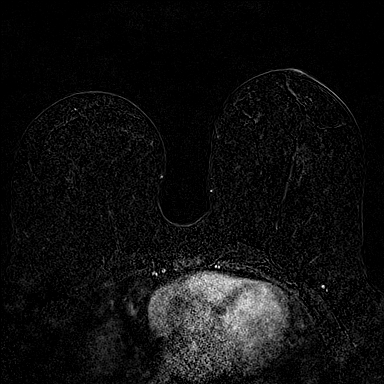
[im 100/144]
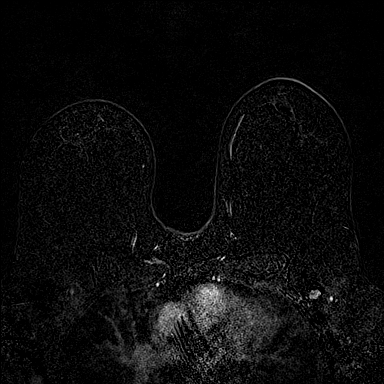
[im 122/144]
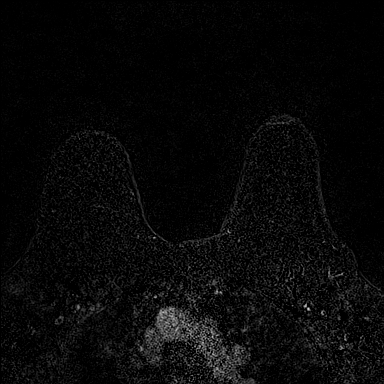
[im 144/144]
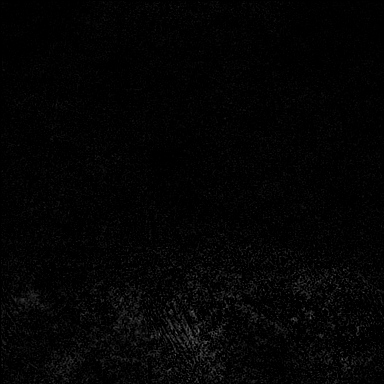

[Series 6: fl3d post-cm 20 · axial · 172.8mm · 0.94mm/px · 1 of 1 slices shown (3 of 3)]
[im 1/1]
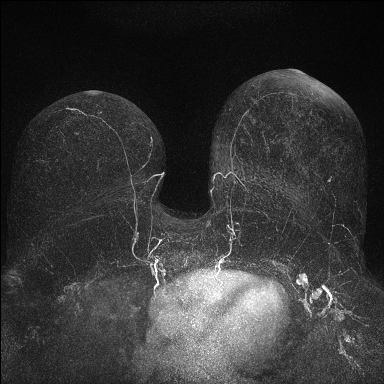

[30 of 48 positions shown; findings below may reference images not displayed]

Three-dimensional MR images were rendered by post-processing of the
original MR data on an independent workstation. The
three-dimensional MR images were interpreted, and findings are
reported in the following complete MRI report for this study. Three
dimensional images were evaluated at the independent [employer]
workstation
FINDINGS: Breast composition: b. Scattered fibroglandular tissue.

Background parenchymal enhancement: Minimal

Right breast: No mass or abnormal enhancement.

Left breast: No mass or abnormal enhancement.

Lymph nodes: No abnormal appearing lymph nodes.

Ancillary findings: Scattered T2 bright lesions are identified
within the liver and are consistent with benign cysts, as seen on
previous CT exam.
IMPRESSION: No MRI evidence for malignancy in either breast.

RECOMMENDATION:
Recommend annual screening mammography.

Recommend annual MRI in addition to mammography, given the patient's
mutation for the BRCA gene.

BI-RADS CATEGORY  1: Negative.

## 2022-03-30 DIAGNOSIS — J069 Acute upper respiratory infection, unspecified: Secondary | ICD-10-CM | POA: Diagnosis not present

## 2022-03-30 DIAGNOSIS — R062 Wheezing: Secondary | ICD-10-CM | POA: Diagnosis not present

## 2022-03-30 DIAGNOSIS — J0100 Acute maxillary sinusitis, unspecified: Secondary | ICD-10-CM | POA: Diagnosis not present

## 2022-06-08 ENCOUNTER — Other Ambulatory Visit (HOSPITAL_COMMUNITY): Payer: Self-pay

## 2022-06-08 MED ORDER — WEGOVY 0.25 MG/0.5ML ~~LOC~~ SOAJ: 0.5000 mL | SUBCUTANEOUS | 0 refills | Status: DC

## 2022-06-08 MED ORDER — WEGOVY 0.25 MG/0.5ML ~~LOC~~ SOAJ
0.5000 mL | SUBCUTANEOUS | 0 refills | Status: DC
  Filled 2022-06-08: qty 2, 28d supply, fill #0

## 2022-06-09 ENCOUNTER — Other Ambulatory Visit (HOSPITAL_COMMUNITY): Payer: Self-pay

## 2022-06-09 MED ORDER — ZEPBOUND 2.5 MG/0.5ML ~~LOC~~ SOAJ
2.5000 mg | SUBCUTANEOUS | 0 refills | Status: DC
  Filled 2022-06-09: qty 2, 28d supply, fill #0

## 2022-06-10 ENCOUNTER — Other Ambulatory Visit (HOSPITAL_COMMUNITY): Payer: Self-pay

## 2022-06-22 ENCOUNTER — Other Ambulatory Visit (HOSPITAL_COMMUNITY): Payer: Self-pay

## 2022-06-22 ENCOUNTER — Other Ambulatory Visit: Payer: Self-pay

## 2022-06-25 ENCOUNTER — Other Ambulatory Visit (HOSPITAL_COMMUNITY): Payer: Self-pay

## 2022-06-25 MED ORDER — WEGOVY 0.5 MG/0.5ML ~~LOC~~ SOAJ
0.5000 mg | SUBCUTANEOUS | 0 refills | Status: DC
  Filled 2022-07-14: qty 2, 28d supply, fill #0

## 2022-06-28 ENCOUNTER — Other Ambulatory Visit (HOSPITAL_COMMUNITY): Payer: Self-pay

## 2022-07-14 ENCOUNTER — Other Ambulatory Visit (HOSPITAL_COMMUNITY): Payer: Self-pay

## 2022-07-14 MED ORDER — WEGOVY 0.25 MG/0.5ML ~~LOC~~ SOAJ: 0.2500 mg | SUBCUTANEOUS | 0 refills | Status: DC

## 2022-07-14 MED ORDER — WEGOVY 0.5 MG/0.5ML ~~LOC~~ SOAJ
0.5000 mg | SUBCUTANEOUS | 0 refills | Status: DC
  Filled 2022-07-14: qty 2, 28d supply, fill #0

## 2022-07-20 ENCOUNTER — Other Ambulatory Visit (HOSPITAL_COMMUNITY): Payer: Self-pay

## 2022-07-22 DIAGNOSIS — Z13220 Encounter for screening for lipoid disorders: Secondary | ICD-10-CM | POA: Diagnosis not present

## 2022-07-22 DIAGNOSIS — Z13228 Encounter for screening for other metabolic disorders: Secondary | ICD-10-CM | POA: Diagnosis not present

## 2022-07-22 DIAGNOSIS — Z131 Encounter for screening for diabetes mellitus: Secondary | ICD-10-CM | POA: Diagnosis not present

## 2022-07-22 DIAGNOSIS — Z130 Encounter for screening for diseases of the blood and blood-forming organs and certain disorders involving the immune mechanism: Secondary | ICD-10-CM | POA: Diagnosis not present

## 2022-07-22 DIAGNOSIS — Z1231 Encounter for screening mammogram for malignant neoplasm of breast: Secondary | ICD-10-CM | POA: Diagnosis not present

## 2022-07-22 DIAGNOSIS — Z01419 Encounter for gynecological examination (general) (routine) without abnormal findings: Secondary | ICD-10-CM | POA: Diagnosis not present

## 2022-07-23 ENCOUNTER — Other Ambulatory Visit: Payer: Self-pay | Admitting: Obstetrics and Gynecology

## 2022-07-23 DIAGNOSIS — Z9189 Other specified personal risk factors, not elsewhere classified: Secondary | ICD-10-CM

## 2022-07-23 DIAGNOSIS — Z1239 Encounter for other screening for malignant neoplasm of breast: Secondary | ICD-10-CM | POA: Diagnosis not present

## 2022-08-11 ENCOUNTER — Other Ambulatory Visit (HOSPITAL_COMMUNITY): Payer: Self-pay

## 2022-08-11 DIAGNOSIS — R7401 Elevation of levels of liver transaminase levels: Secondary | ICD-10-CM | POA: Diagnosis not present

## 2022-08-11 MED ORDER — WEGOVY 1 MG/0.5ML ~~LOC~~ SOAJ
1.0000 mg | SUBCUTANEOUS | 0 refills | Status: DC
  Filled 2022-08-11: qty 2, 28d supply, fill #0

## 2022-08-11 MED ORDER — WEGOVY 1.7 MG/0.75ML ~~LOC~~ SOAJ
1.7000 mg | SUBCUTANEOUS | 0 refills | Status: DC
  Filled 2022-08-11 – 2022-09-20 (×2): qty 3, 28d supply, fill #0

## 2022-08-19 ENCOUNTER — Ambulatory Visit
Admission: RE | Admit: 2022-08-19 | Discharge: 2022-08-19 | Disposition: A | Discharge status: Home / Self Care | Payer: Federal, State, Local not specified - PPO | Source: Ambulatory Visit | Attending: Obstetrics and Gynecology | Admitting: Obstetrics and Gynecology

## 2022-08-19 DIAGNOSIS — Z9189 Other specified personal risk factors, not elsewhere classified: Secondary | ICD-10-CM

## 2022-08-24 DIAGNOSIS — R202 Paresthesia of skin: Secondary | ICD-10-CM | POA: Diagnosis not present

## 2022-08-25 ENCOUNTER — Encounter: Payer: Self-pay | Admitting: Neurology

## 2022-09-09 ENCOUNTER — Other Ambulatory Visit: Payer: Federal, State, Local not specified - PPO

## 2022-09-20 ENCOUNTER — Other Ambulatory Visit (HOSPITAL_COMMUNITY): Payer: Self-pay

## 2022-09-20 MED ORDER — WEGOVY 2.4 MG/0.75ML ~~LOC~~ SOAJ
2.4000 mg | SUBCUTANEOUS | 2 refills | Status: DC
  Filled 2022-09-20: qty 3, 28d supply, fill #0

## 2022-09-28 ENCOUNTER — Other Ambulatory Visit (HOSPITAL_COMMUNITY): Payer: Self-pay

## 2022-10-18 DIAGNOSIS — Z13820 Encounter for screening for osteoporosis: Secondary | ICD-10-CM | POA: Diagnosis not present

## 2022-10-21 ENCOUNTER — Inpatient Hospital Stay: Admission: RE | Admit: 2022-10-21 | Payer: Self-pay | Source: Ambulatory Visit

## 2022-11-02 ENCOUNTER — Other Ambulatory Visit (HOSPITAL_COMMUNITY): Payer: Self-pay

## 2022-11-02 MED ORDER — WEGOVY 2.4 MG/0.75ML ~~LOC~~ SOAJ
2.4000 mg | SUBCUTANEOUS | 0 refills | Status: DC
  Filled 2022-11-02: qty 3, 28d supply, fill #0

## 2022-11-04 ENCOUNTER — Other Ambulatory Visit (HOSPITAL_COMMUNITY): Payer: Self-pay

## 2022-11-16 ENCOUNTER — Other Ambulatory Visit (HOSPITAL_COMMUNITY): Payer: Self-pay

## 2022-11-16 MED ORDER — WEGOVY 2.4 MG/0.75ML ~~LOC~~ SOAJ
2.4000 mg | SUBCUTANEOUS | 0 refills | Status: DC
  Filled 2022-11-30: qty 3, 28d supply, fill #0

## 2022-11-16 NOTE — Progress Notes (Unsigned)
NEUROLOGY CONSULTATION NOTE  AREIL OTTEY MRN: [id number] DOB: Nov 05, 1970  Referring provider: Laurann Montana, MD Primary care provider: Laurann Montana, MD  Reason for consult:  residual neuralgia from Bell's palsy  Assessment/Plan:   History of left Bell's palsy Left sided facial pain, while possible it could be residual from the Bell's palsy, it is more consistent with the trigeminal nerve rather than the facial nerve.      Check MRI of brain and left trigeminal nerve with and without contrast to evaluate for other potential causes of left facial pain. Further recommendations pending results.   Subjective:  [name] is a 52 year old left-handed female with hypothyroidism, anxiety and history of melanoma who presents for residual neuralgia from Bell's palsy.   She was diagnosed with left sided Bell's palsy in October 2018.  She had left sided upper and lower facial weakness preceded by left sided headache.  She was prescribed prednisone and valacyclovir.  Facial weakness largely improved and loss of taste returned after about 2 weeks.  Since then, she has sensitivity over the left V2 distribution.  Sometimes the discomfort flares up about once a week, often aggravated when she has a migraine that occurs every 10 to 14 days.  At that time, if she lightly touches her cheek, she will feels a pain radiating up from the cheek to the left eye.  No facial weakness, change in hearing, change in taste or decreased facial sensation.     PAST MEDICAL HISTORY: Past Medical History:  Diagnosis Date   AMA (advanced maternal age) multigravida 35+    Anxiety    Cancer (HCC)    skin   H/O varicella    Migraine    Newborn product of in vitro fertilization (IVF) pregnancy    PONV (postoperative nausea and vomiting)     PAST SURGICAL HISTORY: Past Surgical History:  Procedure Laterality Date   CESAREAN SECTION  03/21/2012   Procedure: CESAREAN SECTION;  Surgeon: Leslie Andrea, MD;  Location: [facility];  Service: Obstetrics;  Laterality: N/A;   CESAREAN SECTION N/A 02/07/2018   Procedure: REPEAT CESAREAN SECTION;  Surgeon: Harold Hedge, MD;  Location: [facility];  Service: Obstetrics;  Laterality: N/A;  Repeat edc 02/20/18 allergy to [employer] RNFA   GYNECOLOGIC CRYOSURGERY     MELANOMA EXCISION  2005   L arm   WISDOM TOOTH EXTRACTION  1994    MEDICATIONS: Current Outpatient Medications on File Prior to Visit  Medication Sig Dispense Refill   docusate sodium (COLACE) 100 MG capsule Take 1 capsule (100 mg total) by mouth daily as needed for mild constipation.  0   ibuprofen (ADVIL,MOTRIN) 600 MG tablet Take 1 tablet (600 mg total) by mouth every 6 (six) hours as needed. (Patient not taking: Reported on 03/17/2018) 30 tablet 0   levothyroxine (SYNTHROID, LEVOTHROID) 50 MCG tablet Take 50 mcg by mouth daily.   0   oxymetazoline (AFRIN) 0.05 % nasal spray Place 1 spray into both nostrils 2 (two) times daily as needed for congestion.     Prenatal Vit-Fe Fumarate-FA (PRENATAL MULTIVITAMIN) TABS tablet Take 1 tablet by mouth daily.      Semaglutide-Weight Management (WEGOVY) 0.25 MG/0.5ML SOAJ Inject 0.25 mg into the skin once a week. 2 mL 0   Semaglutide-Weight Management (WEGOVY) 0.25 MG/0.5ML SOAJ Inject 0.25 mg into the skin once a week. 2 mL 0   Semaglutide-Weight Management (WEGOVY) 0.25 MG/0.5ML SOAJ Inject 0.25 mg into the  skin once a week. 2 mL 0   Semaglutide-Weight Management (WEGOVY) 0.5 MG/0.5ML SOAJ Inject 0.5 mg into the skin once a week. 2 mL 0   Semaglutide-Weight Management (WEGOVY) 0.5 MG/0.5ML SOAJ Inject 0.5 mg into the skin once a week. 2 mL 0   Semaglutide-Weight Management (WEGOVY) 1 MG/0.5ML SOAJ Inject 1 mg into the skin once a week. 2 mL 0   Semaglutide-Weight Management (WEGOVY) 1.7 MG/0.75ML SOAJ Inject 1.7 mg into the skin once a week. 3 mL 0   Semaglutide-Weight Management (WEGOVY) 2.4 MG/0.75ML SOAJ Inject 2.4 mg into the  skin once weekly 3 mL 2   Semaglutide-Weight Management (WEGOVY) 2.4 MG/0.75ML SOAJ Inject 2.4 mg into the skin once a week. 3 mL 0   sertraline (ZOLOFT) 100 MG tablet Take 100 mg by mouth daily.      tirzepatide (ZEPBOUND) 2.5 MG/0.5ML Pen Inject 2.5 mg into the skin once a week. 2 mL 0   No current facility-administered medications on file prior to visit.    ALLERGIES: Allergies  Allergen Reactions   Iron Anaphylaxis and Other (See Comments)    Iron infusion reaction   Acyclovir And Related Other (See Comments)    Unknown   Erythromycin Nausea And Vomiting   Augmentin [Amoxicillin-Pot Clavulanate] Nausea And Vomiting, Anxiety and Other (See Comments)    "panic attack"    FAMILY HISTORY: Family History  Problem Relation Age of Onset   Depression Mother    Hypertension Father    Diabetes Father    COPD Maternal Grandmother    Cancer Maternal Grandmother    COPD Paternal Grandmother    Heart attack Paternal Grandfather    Other Neg Hx     Objective:  Blood pressure 112/78, pulse 90, height 5\' 8"  (1.727 m), weight 225 lb 12.8 oz (102.4 kg), SpO2 94 %, unknown if currently breastfeeding. General: No acute distress.  Patient appears well-groomed.   Head:  Normocephalic/atraumatic Eyes:  fundi examined but not visualized Neck: supple, no paraspinal tenderness, full range of motion Back: No paraspinal tenderness Heart: regular rate and rhythm Neurological Exam: Mental status: alert and oriented to person, place, and time, speech fluent and not dysarthric, language intact. Cranial nerves: CN I: not tested CN II: pupils equal, round and reactive to light, visual fields intact CN III, IV, VI:  full range of motion, no nystagmus, no ptosis CN V: hyperesthesia left V2. CN VII: upper and lower face symmetric CN VIII: hearing intact CN IX, X: gag intact, uvula midline CN XI: sternocleidomastoid and trapezius muscles intact CN XII: tongue midline Bulk & Tone: normal, no  fasciculations. Motor:  muscle strength 5/5 throughout Sensation:  Temperature and vibratory sensation intact. Deep Tendon Reflexes:  2+ throughout,  toes downgoing.   Finger to nose testing:  Without dysmetria.   Heel to shin:  Without dysmetria.   Gait:  Normal station and stride.  Romberg negative.    Thank you for allowing me to take part in the care of this patient.  Shon Millet, DO  CC: Laurann Montana, MD

## 2022-11-17 ENCOUNTER — Encounter: Payer: Self-pay | Admitting: Neurology

## 2022-11-17 ENCOUNTER — Other Ambulatory Visit (HOSPITAL_COMMUNITY): Payer: Self-pay

## 2022-11-17 ENCOUNTER — Ambulatory Visit (INDEPENDENT_AMBULATORY_CARE_PROVIDER_SITE_OTHER): Payer: Self-pay | Admitting: Neurology

## 2022-11-17 VITALS — BP 112/78 | HR 90 | Ht 68.0 in | Wt 225.8 lb

## 2022-11-17 DIAGNOSIS — R519 Headache, unspecified: Secondary | ICD-10-CM

## 2022-11-17 DIAGNOSIS — Z8669 Personal history of other diseases of the nervous system and sense organs: Secondary | ICD-10-CM

## 2022-11-17 NOTE — Patient Instructions (Signed)
Check MRI of brain and left trigeminal nerve with and without contrast

## 2022-11-30 ENCOUNTER — Other Ambulatory Visit (HOSPITAL_COMMUNITY): Payer: Self-pay

## 2022-12-03 DIAGNOSIS — E669 Obesity, unspecified: Secondary | ICD-10-CM | POA: Diagnosis not present

## 2022-12-15 DIAGNOSIS — U071 COVID-19: Secondary | ICD-10-CM | POA: Diagnosis not present

## 2022-12-24 ENCOUNTER — Ambulatory Visit
Admission: RE | Admit: 2022-12-24 | Discharge: 2022-12-24 | Disposition: A | Discharge status: Home / Self Care | Payer: Federal, State, Local not specified - PPO | Source: Ambulatory Visit | Attending: Neurology

## 2022-12-24 ENCOUNTER — Ambulatory Visit
Admission: RE | Admit: 2022-12-24 | Discharge: 2022-12-24 | Disposition: A | Discharge status: Home / Self Care | Payer: Federal, State, Local not specified - PPO | Source: Ambulatory Visit | Attending: Neurology | Admitting: Neurology

## 2022-12-24 DIAGNOSIS — Z8669 Personal history of other diseases of the nervous system and sense organs: Secondary | ICD-10-CM | POA: Diagnosis not present

## 2022-12-24 DIAGNOSIS — R519 Headache, unspecified: Secondary | ICD-10-CM

## 2022-12-24 MED ORDER — GADOPICLENOL 0.5 MMOL/ML IV SOLN
10.0000 mL | Freq: Once | INTRAVENOUS | Status: AC | PRN
  Administered 2022-12-24: 10 mL via INTRAVENOUS

## 2022-12-27 ENCOUNTER — Other Ambulatory Visit (HOSPITAL_COMMUNITY): Payer: Self-pay

## 2022-12-27 MED ORDER — WEGOVY 2.4 MG/0.75ML ~~LOC~~ SOAJ
2.4000 mg | SUBCUTANEOUS | 0 refills | Status: DC
  Filled 2022-12-27: qty 3, 28d supply, fill #0

## 2022-12-30 ENCOUNTER — Other Ambulatory Visit: Payer: Self-pay

## 2023-01-02 DIAGNOSIS — S51852A Open bite of left forearm, initial encounter: Secondary | ICD-10-CM | POA: Diagnosis not present

## 2023-01-02 DIAGNOSIS — L089 Local infection of the skin and subcutaneous tissue, unspecified: Secondary | ICD-10-CM | POA: Diagnosis not present

## 2023-01-02 DIAGNOSIS — W5501XA Bitten by cat, initial encounter: Secondary | ICD-10-CM | POA: Diagnosis not present

## 2023-01-03 ENCOUNTER — Telehealth: Payer: Self-pay | Admitting: Neurology

## 2023-01-03 NOTE — Telephone Encounter (Signed)
Patient is calling for MRI results.

## 2023-01-03 NOTE — Telephone Encounter (Signed)
Called pt and she wanted the results of MRI but they have not been released at this time. She wanted Dr Everlena Cooper to know.

## 2023-01-07 NOTE — Progress Notes (Signed)
Patient advised, will call back to let us know if she wants to start the medication. Transferred to the front to schedule 6 moth follow up.

## 2023-01-17 ENCOUNTER — Other Ambulatory Visit: Payer: Self-pay | Admitting: Neurology

## 2023-01-18 DIAGNOSIS — D225 Melanocytic nevi of trunk: Secondary | ICD-10-CM | POA: Diagnosis not present

## 2023-01-18 DIAGNOSIS — Z86018 Personal history of other benign neoplasm: Secondary | ICD-10-CM | POA: Diagnosis not present

## 2023-01-18 DIAGNOSIS — Z85820 Personal history of malignant melanoma of skin: Secondary | ICD-10-CM | POA: Diagnosis not present

## 2023-01-18 DIAGNOSIS — L814 Other melanin hyperpigmentation: Secondary | ICD-10-CM | POA: Diagnosis not present

## 2023-01-27 ENCOUNTER — Inpatient Hospital Stay: Admission: RE | Admit: 2023-01-27 | Payer: Self-pay | Source: Ambulatory Visit

## 2023-01-27 ENCOUNTER — Other Ambulatory Visit (HOSPITAL_COMMUNITY): Payer: Self-pay

## 2023-01-27 MED ORDER — WEGOVY 2.4 MG/0.75ML ~~LOC~~ SOAJ
2.4000 mg | SUBCUTANEOUS | 0 refills | Status: DC
  Filled 2023-01-27: qty 3, 28d supply, fill #0

## 2023-01-27 NOTE — Telephone Encounter (Signed)
Patient advised of Dr.Jaffe note, The MRIs of brain and trigeminal nerves are normal.  At this point, we can treat the pain/discomfort.  If the facial pain is disruptive, we can start a medication called gabapentin.  If she is agreeable, please send gabapentin 100mg  three times daily.  We can increase dose in 4 weeks (before picking up refill) if needed.  Then have her make a follow up appointment in 6 to 7 months.    Patient will call if she wants to start medication. Pain intermittent.

## 2023-02-05 ENCOUNTER — Ambulatory Visit
Admission: RE | Admit: 2023-02-05 | Discharge: 2023-02-05 | Disposition: A | Discharge status: Home / Self Care | Payer: Self-pay | Source: Ambulatory Visit | Attending: Obstetrics and Gynecology | Admitting: Obstetrics and Gynecology

## 2023-02-05 DIAGNOSIS — Z9189 Other specified personal risk factors, not elsewhere classified: Secondary | ICD-10-CM

## 2023-02-05 MED ORDER — GADOPICLENOL 0.5 MMOL/ML IV SOLN
7.5000 mL | Freq: Once | INTRAVENOUS | Status: AC | PRN
  Administered 2023-02-05: 7.5 mL via INTRAVENOUS

## 2023-02-23 ENCOUNTER — Other Ambulatory Visit (HOSPITAL_COMMUNITY): Payer: Self-pay

## 2023-02-23 MED ORDER — WEGOVY 1.7 MG/0.75ML ~~LOC~~ SOAJ
1.7000 mg | SUBCUTANEOUS | 1 refills | Status: DC
Start: 2023-02-23 — End: 2023-07-06
  Filled 2023-02-23: qty 3, 28d supply, fill #0

## 2023-02-28 ENCOUNTER — Other Ambulatory Visit (HOSPITAL_COMMUNITY): Payer: Self-pay

## 2023-03-23 ENCOUNTER — Other Ambulatory Visit (HOSPITAL_COMMUNITY): Payer: Self-pay

## 2023-03-23 MED ORDER — WEGOVY 1.7 MG/0.75ML ~~LOC~~ SOAJ
1.7000 mg | SUBCUTANEOUS | 1 refills | Status: DC
  Filled 2023-03-23: qty 3, 28d supply, fill #0
  Filled 2023-04-25: qty 3, 28d supply, fill #1

## 2023-04-25 ENCOUNTER — Other Ambulatory Visit (HOSPITAL_COMMUNITY): Payer: Self-pay

## 2023-05-03 DIAGNOSIS — F419 Anxiety disorder, unspecified: Secondary | ICD-10-CM | POA: Diagnosis not present

## 2023-05-03 DIAGNOSIS — E039 Hypothyroidism, unspecified: Secondary | ICD-10-CM | POA: Diagnosis not present

## 2023-05-03 DIAGNOSIS — R600 Localized edema: Secondary | ICD-10-CM | POA: Diagnosis not present

## 2023-05-07 ENCOUNTER — Emergency Department (HOSPITAL_BASED_OUTPATIENT_CLINIC_OR_DEPARTMENT_OTHER)
Admission: EM | Admit: 2023-05-07 | Discharge: 2023-05-07 | Disposition: A | Discharge status: Home / Self Care | Payer: Federal, State, Local not specified - PPO | Attending: Emergency Medicine | Admitting: Emergency Medicine

## 2023-05-07 ENCOUNTER — Emergency Department (HOSPITAL_BASED_OUTPATIENT_CLINIC_OR_DEPARTMENT_OTHER): Payer: Federal, State, Local not specified - PPO

## 2023-05-07 ENCOUNTER — Encounter (HOSPITAL_BASED_OUTPATIENT_CLINIC_OR_DEPARTMENT_OTHER): Payer: Self-pay

## 2023-05-07 ENCOUNTER — Other Ambulatory Visit: Payer: Self-pay

## 2023-05-07 DIAGNOSIS — M25512 Pain in left shoulder: Secondary | ICD-10-CM | POA: Insufficient documentation

## 2023-05-07 DIAGNOSIS — R0789 Other chest pain: Secondary | ICD-10-CM | POA: Insufficient documentation

## 2023-05-07 DIAGNOSIS — E039 Hypothyroidism, unspecified: Secondary | ICD-10-CM | POA: Diagnosis not present

## 2023-05-07 DIAGNOSIS — M542 Cervicalgia: Secondary | ICD-10-CM | POA: Diagnosis not present

## 2023-05-07 DIAGNOSIS — Z79899 Other long term (current) drug therapy: Secondary | ICD-10-CM | POA: Diagnosis not present

## 2023-05-07 DIAGNOSIS — M25519 Pain in unspecified shoulder: Secondary | ICD-10-CM | POA: Diagnosis not present

## 2023-05-07 DIAGNOSIS — R079 Chest pain, unspecified: Secondary | ICD-10-CM | POA: Diagnosis not present

## 2023-05-07 LAB — CBC
HCT: 43.2 % (ref 36.0–46.0)
Hemoglobin: 14.9 g/dL (ref 12.0–15.0)
MCH: 29.4 pg (ref 26.0–34.0)
MCHC: 34.5 g/dL (ref 30.0–36.0)
MCV: 85.4 fL (ref 80.0–100.0)
Platelets: 204 K/uL (ref 150–400)
RBC: 5.06 MIL/uL (ref 3.87–5.11)
RDW: 11.9 % (ref 11.5–15.5)
WBC: 8 K/uL (ref 4.0–10.5)
nRBC: 0 % (ref 0.0–0.2)

## 2023-05-07 LAB — TROPONIN I (HIGH SENSITIVITY)
Troponin I (High Sensitivity): 26 ng/L — ABNORMAL HIGH (ref <18)
Troponin I (High Sensitivity): 30 ng/L — ABNORMAL HIGH (ref <18)

## 2023-05-07 LAB — BASIC METABOLIC PANEL WITH GFR
Anion gap: 8 (ref 5–15)
BUN: 15 mg/dL (ref 6–20)
CO2: 30 mmol/L (ref 22–32)
Calcium: 9.6 mg/dL (ref 8.9–10.3)
Chloride: 98 mmol/L (ref 98–111)
Creatinine, Ser: 0.68 mg/dL (ref 0.44–1.00)
GFR, Estimated: >60 mL/min (ref >60)
Glucose, Bld: 92 mg/dL (ref 70–99)
Potassium: 4.1 mmol/L (ref 3.5–5.1)
Sodium: 136 mmol/L (ref 135–145)

## 2023-05-07 MED ORDER — LIDOCAINE 5 % EX PTCH: 1.0000 | MEDICATED_PATCH | CUTANEOUS | 0 refills | Status: AC

## 2023-05-07 MED ORDER — KETOROLAC TROMETHAMINE 15 MG/ML IJ SOLN
15.0000 mg | Freq: Once | INTRAMUSCULAR | Status: AC
  Administered 2023-05-07: 15 mg via INTRAMUSCULAR
  Filled 2023-05-07: qty 1

## 2023-05-07 MED ORDER — LIDOCAINE 5 % EX PTCH
1.0000 | MEDICATED_PATCH | CUTANEOUS | Status: DC
  Administered 2023-05-07: 1 via TRANSDERMAL
  Filled 2023-05-07: qty 1

## 2023-05-07 MED ORDER — CYCLOBENZAPRINE HCL 10 MG PO TABS: 10.0000 mg | ORAL_TABLET | Freq: Two times a day (BID) | ORAL | 0 refills | Status: AC | PRN

## 2023-05-07 NOTE — Discharge Instructions (Addendum)
You were seen in the emergency room today for shoulder pain.  Your exam is consistent with musculoskeletal injury.  Recommend taking muscle relaxer, ibuprofen and using lidocaine patch.  You can also use ice and heat over this area of pain.  Please follow-up with primary care today to ensure today's treatment has helped improve your symptoms.  Return to emergency room if you have any new or worsening symptoms.  You did have elevated troponin however your repeat troponin is stable.  I feel that your pain is very likely musculoskeletal in origin, I do not feel that it is cardiac in nature however due to your history of high blood pressure, I have placed referral to cardiology for further workup and evaluation.

## 2023-05-07 NOTE — ED Triage Notes (Addendum)
The patient is having left neck/ shoulder pain for 10 days. Pt denied chest pain or shortness of breath.

## 2023-05-07 NOTE — ED Provider Notes (Cosign Needed)
[name] AT [facility] Provider Note   CSN: [id number] Arrival date & time: 05/07/23  1338     History  Chief Complaint  Patient presents with   Shoulder Pain    [name] is a 52 y.o. female patient with past medical history of anxiety, hypothyroidism, urinary tract infection, migraine with aura presented to emergency room with left shoulder pain.  Patient reports it feels like a sore muscle that is radiating to her left arm.  Pain is worse with range of motion, tender to the touch.  Patient reports this pain woke her up out of her sleep last night.  Has not tried anything for the pain.  Was seen at urgent care and sent here for CS rule out.  Upon speaking to the patient she denies any chest pain, shortness of breath, palpitations.  Patient feels this is muscular in nature however did not have any recent injury trauma or fall.   Shoulder Pain      Home Medications Prior to Admission medications   Medication Sig Start Date End Date Taking? Authorizing Provider  docusate sodium (COLACE) 100 MG capsule Take 1 capsule (100 mg total) by mouth daily as needed for mild constipation. 03/20/18   April Rude, PA-C  ibuprofen (ADVIL,MOTRIN) 600 MG tablet Take 1 tablet (600 mg total) by mouth every 6 (six) hours as needed. 02/10/18   Zelphia Cairo, MD  levothyroxine (SYNTHROID, LEVOTHROID) 50 MCG tablet Take 50 mcg by mouth daily.  07/25/15   [provider]  oxymetazoline (AFRIN) 0.05 % nasal spray Place 1 spray into both nostrils 2 (two) times daily as needed for congestion.    [provider]  Semaglutide-Weight Management (WEGOVY) 0.25 MG/0.5ML SOAJ Inject 0.25 mg into the skin once a week. 06/08/22     Semaglutide-Weight Management (WEGOVY) 0.25 MG/0.5ML SOAJ Inject 0.25 mg into the skin once a week. 06/08/22     Semaglutide-Weight Management (WEGOVY) 0.25 MG/0.5ML SOAJ Inject 0.25 mg into the skin once a week. 07/14/22      Semaglutide-Weight Management (WEGOVY) 0.5 MG/0.5ML SOAJ Inject 0.5 mg into the skin once a week. 06/25/22     Semaglutide-Weight Management (WEGOVY) 0.5 MG/0.5ML SOAJ Inject 0.5 mg into the skin once a week. 07/14/22     Semaglutide-Weight Management (WEGOVY) 1 MG/0.5ML SOAJ Inject 1 mg into the skin once a week. 08/11/22     Semaglutide-Weight Management (WEGOVY) 1.7 MG/0.75ML SOAJ Inject 1.7 mg into the skin once a week. 08/11/22     Semaglutide-Weight Management (WEGOVY) 1.7 MG/0.75ML SOAJ Inject 1.7 mg into the skin once a week. 02/23/23     Semaglutide-Weight Management (WEGOVY) 1.7 MG/0.75ML SOAJ Inject 1.7 mg into the skin once a week. 03/23/23     Semaglutide-Weight Management (WEGOVY) 2.4 MG/0.75ML SOAJ Inject 2.4 mg into the skin once weekly 09/20/22     Semaglutide-Weight Management (WEGOVY) 2.4 MG/0.75ML SOAJ Inject 2.4 mg into the skin once a week. 11/02/22     Semaglutide-Weight Management (WEGOVY) 2.4 MG/0.75ML SOAJ Inject 2.4 mg into the skin once a week. 11/16/22     Semaglutide-Weight Management (WEGOVY) 2.4 MG/0.75ML SOAJ Inject 2.4 mg into the skin once a week. 12/27/22     Semaglutide-Weight Management (WEGOVY) 2.4 MG/0.75ML SOAJ Inject 2.4 mg into the skin once a week. 01/27/23     sertraline (ZOLOFT) 100 MG tablet Take 100 mg by mouth daily.     [provider]      Allergies  Iron, Acyclovir and related, Erythromycin, and Augmentin [amoxicillin-pot clavulanate]    Review of Systems   Review of Systems  Musculoskeletal:  Positive for arthralgias.    Physical Exam Updated Vital Signs BP 128/81 (BP Location: Right Arm)   Pulse 95   Temp 99 F (37.2 C)   Resp 18   Ht 5\' 8"  (1.727 m)   Wt 102 kg   SpO2 95%   BMI 34.19 kg/m  Physical Exam Vitals and nursing note reviewed.  Constitutional:      General: She is not in acute distress.    Appearance: She is not toxic-appearing.  HENT:     Head: Normocephalic and atraumatic.  Eyes:     General: No scleral  icterus.    Conjunctiva/sclera: Conjunctivae normal.  Cardiovascular:     Rate and Rhythm: Normal rate and regular rhythm.     Pulses: Normal pulses.     Heart sounds: Normal heart sounds.  Pulmonary:     Effort: Pulmonary effort is normal. No respiratory distress.     Breath sounds: Normal breath sounds.  Abdominal:     General: Abdomen is flat. Bowel sounds are normal.     Palpations: Abdomen is soft.     Tenderness: There is no abdominal tenderness.  Musculoskeletal:     Comments: Tenderness to palpation over left traps, left shoulder and left side of chest wall.  No deformity or bruising.  Skin:    General: Skin is warm and dry.     Findings: No lesion.  Neurological:     General: No focal deficit present.     Mental Status: She is alert and oriented to person, place, and time. Mental status is at baseline.     ED Results / Procedures / Treatments   Labs (all labs ordered are listed, but only abnormal results are displayed) Labs Reviewed  BASIC METABOLIC PANEL  CBC  TROPONIN I (HIGH SENSITIVITY)    EKG None  Radiology No results found.  Procedures Procedures    Medications Ordered in ED Medications  lidocaine (LIDODERM) 5 % 1 patch (1 patch Transdermal Patch Applied 05/07/23 1600)  ketorolac (TORADOL) 15 MG/ML injection 15 mg (15 mg Intramuscular Given 05/07/23 1601)    ED Course/ Medical Decision Making/ A&P                                 Medical Decision Making Amount and/or Complexity of Data Reviewed Labs: ordered. Radiology: ordered.  Risk Prescription drug management.   [name] 52 y.o. presented today for chest pain. Working DDx that I considered at this time includes, but not limited to, ACS, GERD, pe, pna, aortic dissection, pneumothorax, MSK path, anemia, esophageal rupture, CHF exacerbation, valvular disorder, myocarditis, pericarditis, endocarditis, pericardial effusion/cardiac tamponade, pulmonary edema, gastritis/PUD,  esophagitis.  R/o Dx: These are considered less likely due to history of present illness and physical exam findings.  PE: advanced imaging will not be ordered as alternative diagnoses are more likely at this time Aortic Dissection: less likely based on the history of present illness - location, quality, onset, and severity of symptoms in this case.   Review of prior external notes: None   Unique Tests and My Interpretation:  EKG: Rate, rhythm, axis, intervals all examined: sinus   Troponin: 26, repeat stable CXR: no acute pathology  CBC: no leukocytosis, no anemia BMP: no electrolyte abnormality, no aki   PERC negative.  Problem List / ED Course / Critical interventions / Medication management  Patient reporting with left shoulder pain.  Was seen in urgent care and sent here with concerns of atypical ACS like presentation.  Patient does not have history of hypertension nor hyperlipidemia.  Has had previous negative cardiac workup including negative stress test and normal echo.  Obtaining basic labs as well as troponin to rule out ACS.  Patient's EKG without any acute abnormality.  Chest x-ray without acute abnormality.  Patient is PERC negative.  I doubt this is cardiac in nature given presentation.  Sounds most likely musculoskeletal in nature.  Symptoms improved after Toradol and lidocaine patch.  Will send home with muscle relaxer, lidocaine patch and anti-inflammatories.  Patient has close follow-up with primary care.  Discussed case with attending Dr. Deretha Emory secondary to elevated troponin who agrees to workup treatment and discharge. I ordered medication including Foradil, lidocaine Reevaluation of the patient after these medicines showed that the patient improved Patients vitals assessed. Upon arrival patient is hemodynamically stable.  I have reviewed the patients home medicines and have made adjustments as needed     Plan:  F/u w/ PCP to ensure resolution of sx.  Patient  was given return precautions. Patient stable for discharge at this time.  Patient educated on sx/ dx and verbalized understanding of plan.  Will return to ER w/ new or worsening sx.          Final Clinical Impression(s) / ED Diagnoses Final diagnoses:  Acute pain of left shoulder  Atypical chest pain    Rx / DC Orders ED Discharge Orders     None         Smitty Knudsen, PA-C 05/07/23 1901

## 2023-05-13 DIAGNOSIS — R748 Abnormal levels of other serum enzymes: Secondary | ICD-10-CM | POA: Diagnosis not present

## 2023-05-13 DIAGNOSIS — E7800 Pure hypercholesterolemia, unspecified: Secondary | ICD-10-CM | POA: Diagnosis not present

## 2023-05-13 DIAGNOSIS — R0789 Other chest pain: Secondary | ICD-10-CM | POA: Diagnosis not present

## 2023-05-24 ENCOUNTER — Other Ambulatory Visit (HOSPITAL_COMMUNITY): Payer: Self-pay

## 2023-05-24 MED ORDER — WEGOVY 1.7 MG/0.75ML ~~LOC~~ SOAJ
1.7000 mg | SUBCUTANEOUS | 1 refills | Status: DC
  Filled 2023-05-24: qty 3, 28d supply, fill #0

## 2023-05-25 ENCOUNTER — Other Ambulatory Visit (HOSPITAL_COMMUNITY): Payer: Self-pay

## 2023-05-27 ENCOUNTER — Other Ambulatory Visit (HOSPITAL_COMMUNITY): Payer: Self-pay

## 2023-06-03 ENCOUNTER — Other Ambulatory Visit (HOSPITAL_COMMUNITY): Payer: Self-pay

## 2023-06-21 DIAGNOSIS — E039 Hypothyroidism, unspecified: Secondary | ICD-10-CM | POA: Diagnosis not present

## 2023-06-21 DIAGNOSIS — F329 Major depressive disorder, single episode, unspecified: Secondary | ICD-10-CM | POA: Diagnosis not present

## 2023-06-21 DIAGNOSIS — N951 Menopausal and female climacteric states: Secondary | ICD-10-CM | POA: Diagnosis not present

## 2023-06-21 DIAGNOSIS — R7309 Other abnormal glucose: Secondary | ICD-10-CM | POA: Diagnosis not present

## 2023-06-21 DIAGNOSIS — E7800 Pure hypercholesterolemia, unspecified: Secondary | ICD-10-CM | POA: Diagnosis not present

## 2023-06-21 DIAGNOSIS — E559 Vitamin D deficiency, unspecified: Secondary | ICD-10-CM | POA: Diagnosis not present

## 2023-06-21 DIAGNOSIS — R790 Abnormal level of blood mineral: Secondary | ICD-10-CM | POA: Diagnosis not present

## 2023-06-21 DIAGNOSIS — Z6839 Body mass index (BMI) 39.0-39.9, adult: Secondary | ICD-10-CM | POA: Diagnosis not present

## 2023-06-27 NOTE — Progress Notes (Signed)
 CARDIOLOGY CONSULT NOTE       Patient ID: [name] MRN: [id number] DOB/AGE: [birth year] 53 y.o.  Referring Physician: Cliffton Asters Primary Physician: Laurann Montana, MD Primary Cardiologist: New Reason for Consultation: Chest Pain    HPI:  53 y.o. referred by Dr Cliffton Asters for chest pain. History of GAD, hypothyroidism, has taken Wgovy for weight loss and Migraines.  Seen in ED 05/07/23 for muscular left shoulder pain. Radiated to left arm Worse sleeping on it. No dyspnea, palpitations, syncope or central chest pain. Troponin negative x 2, CXR NAD. And ecg no acute changes. Seen by Dr Donnie Aho in 2016 with normal echo and benign cardiac event monitor.  She works for [employer] features [employer] Married with 4 kids age range 5-29 Sedentary Has 4 cats at home as well   ROS All other systems reviewed and negative except as noted above  Past Medical History:  Diagnosis Date   AMA (advanced maternal age) multigravida 35+    Anxiety    Cancer (HCC)    skin   H/O varicella    Migraine    Newborn product of in vitro fertilization (IVF) pregnancy    PONV (postoperative nausea and vomiting)     Family History  Problem Relation Age of Onset   Depression Mother    Hypertension Father    Diabetes Father    Migraines Paternal Aunt    COPD Maternal Grandmother    Cancer Maternal Grandmother    COPD Paternal Grandmother    Heart attack Paternal Grandfather    Other Neg Hx     Social History   Socioeconomic History   Marital status: Married    Spouse name: Not on file   Number of children: 3   Years of education: Not on file   Highest education level: Not on file  Occupational History    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 She cleans house, shops, does yard work, and drives. Her occupation is Teacher, early years/pre at [employer] She completed Sophomore year in college.      Left handed   Social Drivers of Health   Financial Resource Strain: Low Risk  (01/24/2018)   Overall Financial Resource Strain (CARDIA)    Difficulty of Paying Living Expenses: Not hard at all  Food Insecurity: No Food Insecurity (01/24/2018)   Hunger Vital Sign    Worried About Running Out of Food in the Last Year: Never true    Ran Out of Food in the Last Year: Never true  Transportation Needs: Unknown (01/24/2018)   PRAPARE - Administrator, Civil Service (Medical): No    Lack of Transportation (Non-Medical): Not on file  Physical Activity: Inactive (01/24/2018)   Exercise Vital Sign    Days of Exercise per Week: 0 days    Minutes of Exercise per Session: 0 min  Stress:  Stress Concern Present (01/24/2018)   [employer] of Occupational Health - Occupational Stress Questionnaire    Feeling of Stress : To some extent  Social Connections: Not on file  Intimate Partner Violence: Not At Risk (01/24/2018)   Humiliation, Afraid, Rape, and Kick questionnaire    Fear of Current or Ex-Partner: No    Emotionally Abused: No    Physically Abused: No    Sexually Abused: No    Past Surgical History:  Procedure Laterality Date   CESAREAN SECTION  03/21/2012   Procedure: CESAREAN SECTION;  Surgeon: Leslie Andrea, MD;  Location: [facility];  Service: Obstetrics;  Laterality: N/A;   CESAREAN SECTION N/A 02/07/2018   Procedure: REPEAT CESAREAN SECTION;  Surgeon: Harold Hedge, MD;  Location: [facility] BIRTHING SU[facility];  Service: Obstetrics;  Laterality: N/A;  Repeat edc  02/20/18 allergy to [employer] RNFA   GYNECOLOGIC CRYOSURGERY     MELANOMA EXCISION  2005   L arm   WISDOM TOOTH EXTRACTION  1994      Current Outpatient Medications:    docusate sodium (COLACE) 100 MG capsule, Take 1 capsule (100 mg total) by mouth daily as needed for mild constipation., Disp: , Rfl: 0   ibuprofen (ADVIL,MOTRIN) 600 MG tablet, Take 1 tablet (600 mg total) by mouth every 6 (six) hours as needed., Disp: 30 tablet, Rfl: 0   levothyroxine (SYNTHROID, LEVOTHROID) 50 MCG tablet, Take 50 mcg by mouth daily. , Disp: , Rfl: 0   lidocaine (LIDODERM) 5 %, Place 1 patch onto the skin daily. Remove & Discard patch within 12 hours or as directed by MD, Disp: 30 patch, Rfl: 0   oxymetazoline (AFRIN) 0.05 % nasal spray, Place 1 spray into both nostrils 2 (two) times daily as needed for congestion., Disp: , Rfl:    sertraline (ZOLOFT) 100 MG tablet, Take 100 mg by mouth daily. , Disp: , Rfl:    cyclobenzaprine (FLEXERIL) 10 MG tablet, Take 1 tablet (10 mg total) by mouth 2 (two) times daily as needed for muscle spasms. (Patient not taking: Reported on 07/06/2023), Disp: 20 tablet, Rfl: 0    Physical Exam: There were no vitals taken for this visit.   Affect appropriate Healthy:  appears stated age HEENT: normal Neck supple with no adenopathy JVP normal no bruits no thyromegaly Lungs clear with no wheezing and good diaphragmatic motion Heart:  S1/S2 no murmur, no rub, gallop or click PMI normal Abdomen: benighn, BS positve, no tenderness, no AAA no bruit.  No HSM or HJR Distal pulses intact with no bruits No edema Neuro non-focal Skin warm and dry No muscular weakness  Labs:   Lab Results  Component Value Date   WBC 8.0 05/07/2023   HGB 14.9 05/07/2023   HCT 43.2 05/07/2023   MCV 85.4 05/07/2023   PLT 204 05/07/2023     Radiology: No results found.  EKG: SR rate 92 LAD poor R wave progression   ASSESSMENT AND PLAN:   Chest Pain: atypical left shoulder  radiating to arm. R/O CXR NAD ECG with LAD and poor R wave progression Shared decision making favor cardiac CTA to further risk stratify and echo to assess for structural heart problems given abnormal ECG with LAD and poor R wave progression Thyroid:  continue synthroid replacement TSH with primary GAD:  compnesated Weight loss: discussed low carb diet. Wegovy per primary  Cardiac CTA Lopressor 100 mg 2 hours prior to test BMET TTE  F/U PRN pending studies   Signed:  Charlton Haws 07/06/2023, 8:47 AM

## 2023-06-28 DIAGNOSIS — E039 Hypothyroidism, unspecified: Secondary | ICD-10-CM | POA: Diagnosis not present

## 2023-06-28 DIAGNOSIS — F339 Major depressive disorder, recurrent, unspecified: Secondary | ICD-10-CM | POA: Diagnosis not present

## 2023-06-28 DIAGNOSIS — R5382 Chronic fatigue, unspecified: Secondary | ICD-10-CM | POA: Diagnosis not present

## 2023-06-28 DIAGNOSIS — E559 Vitamin D deficiency, unspecified: Secondary | ICD-10-CM | POA: Diagnosis not present

## 2023-07-06 ENCOUNTER — Ambulatory Visit: Payer: Federal, State, Local not specified - PPO | Attending: Cardiovascular Disease | Admitting: Cardiovascular Disease

## 2023-07-06 ENCOUNTER — Other Ambulatory Visit: Payer: Self-pay

## 2023-07-06 ENCOUNTER — Encounter: Payer: Self-pay | Admitting: Cardiovascular Disease

## 2023-07-06 VITALS — BP 124/82 | HR 83 | Ht 68.0 in | Wt 238.8 lb

## 2023-07-06 DIAGNOSIS — R079 Chest pain, unspecified: Secondary | ICD-10-CM | POA: Diagnosis not present

## 2023-07-06 DIAGNOSIS — R072 Precordial pain: Secondary | ICD-10-CM | POA: Diagnosis not present

## 2023-07-06 DIAGNOSIS — R002 Palpitations: Secondary | ICD-10-CM

## 2023-07-06 DIAGNOSIS — E039 Hypothyroidism, unspecified: Secondary | ICD-10-CM | POA: Diagnosis not present

## 2023-07-06 DIAGNOSIS — F411 Generalized anxiety disorder: Secondary | ICD-10-CM | POA: Diagnosis not present

## 2023-07-06 DIAGNOSIS — E669 Obesity, unspecified: Secondary | ICD-10-CM | POA: Diagnosis not present

## 2023-07-06 DIAGNOSIS — Z6839 Body mass index (BMI) 39.0-39.9, adult: Secondary | ICD-10-CM | POA: Diagnosis not present

## 2023-07-06 LAB — BASIC METABOLIC PANEL
BUN/Creatinine Ratio: 22 (ref 9–23)
BUN: 17 mg/dL (ref 6–24)
CO2: 23 mmol/L (ref 20–29)
Calcium: 9.5 mg/dL (ref 8.7–10.2)
Chloride: 100 mmol/L (ref 96–106)
Creatinine, Ser: 0.77 mg/dL (ref 0.57–1.00)
Glucose: 104 mg/dL — ABNORMAL HIGH (ref 70–99)
Potassium: 4.7 mmol/L (ref 3.5–5.2)
Sodium: 140 mmol/L (ref 134–144)
eGFR: 93 mL/min/{1.73_m2} (ref >59)

## 2023-07-06 MED ORDER — METOPROLOL TARTRATE 50 MG PO TABS
ORAL_TABLET | ORAL | 0 refills | Status: AC
Start: 2023-07-06 — End: ?

## 2023-07-06 MED ORDER — METOPROLOL TARTRATE 100 MG PO TABS: 100.0000 mg | ORAL_TABLET | Freq: Once | ORAL | 0 refills | Status: AC

## 2023-07-06 NOTE — Patient Instructions (Addendum)
 Medication Instructions:  Your physician recommends that you continue on your current medications as directed. Please refer to the Current Medication list given to you today.  *If you need a refill on your cardiac medications before your next appointment, please call your pharmacy*  Lab Work: Your physician recommends that you have lab work today- BMET  If you have labs (blood work) drawn today and your tests are completely normal, you will receive your results only by: MyChart Message (if you have MyChart) OR A paper copy in the mail If you have any lab test that is abnormal or we need to change your treatment, we will call you to review the results.  Testing/Procedures: Your physician has requested that you have cardiac CT. Cardiac computed tomography (CT) is a painless test that uses an x-ray machine to take clear, detailed pictures of your heart. For further information please visit [email or web address] Please follow instruction sheet as given.  Follow-Up: At [facility], you and your health needs are our priority.  As part of our continuing mission to provide you with exceptional heart care, we have created designated Provider Care Teams.  These Care Teams include your primary Cardiologist (physician) and Advanced Practice Providers (APPs -  Physician Assistants and Nurse Practitioners) who all work together to provide you with the care you need, when you need it.  We recommend signing up for the patient portal called "MyChart".  Sign up information is provided on this After Visit Summary.  MyChart is used to connect with patients for Virtual Visits (Telemedicine).  Patients are able to view lab/test results, encounter notes, upcoming appointments, etc.  Non-urgent messages can be sent to your provider as well.   To learn more about what you can do with MyChart, go to [email or web address].    Your next appointment:   As needed  Provider:   Charlton Haws, MD     Other  Instructions   Your cardiac CT will be scheduled at one of the below locations:   [facility] [address] [place], [place] [address] [phone]  If scheduled at [facility], please arrive at the [facility] En[facility]ance (Entrance C2) of [facility] 30 minutes prior to test start time. You can use the FREE valet parking offered at entrance C (encouraged to control the heart rate for the test)  Proceed to the [facility] Radiology Department (first floor) to check-in and test prep.  All radiology patients and guests should use entrance C2 at [facility], accessed from [facility], even though the hospital's physical address listed is [address].       Please follow these instructions carefully (unless otherwise directed):  On the Night Before the Test: Be sure to Drink plenty of water. Do not consume any caffeinated/decaffeinated beverages or chocolate 12 hours prior to your test. Do not take any antihistamines 12 hours prior to your test. Take metoprolol (Lopressor) 50 mg the night before your test.  On the Day of the Test: Drink plenty of water until 1 hour prior to the test. Do not eat any food 1 hour prior to test. You may take your regular medications prior to the test.  Take metoprolol (Lopressor) 100 mg two hours prior to test. If you take Hydrochlorothiazide please HOLD on the morning of the test. Patients who wear a continuous glucose monitor MUST remove the device prior to scanning. FEMALES- please wear underwire-free bra if available, avoid dresses & tight  clothing  After the Test: Drink plenty of water. After receiving IV contrast, you may experience a mild flushed feeling. This is normal. On occasion, you may experience a mild rash up to 24 hours after the test. This is not dangerous. If this occurs, you can take Benadryl 25 mg, Zyrtec, Claritin, or Allegra and increase your fluid intake.  (Patients taking Tikosyn should avoid Benadryl, and may take Zyrtec, Claritin, or Allegra) If you experience trouble breathing, this can be serious. If it is severe call [phone] IMMEDIATELY. If it is mild, please call our office.  We will call to schedule your test 2-4 weeks out understanding that some insurance companies will need an authorization prior to the service being performed.   For more information and frequently asked questions, please visit our website : [email or web address]  For non-scheduling related questions, please contact the cardiac imaging nurse navigator should you have any questions/concerns: Cardiac Imaging Nurse Navigators Direct Office Dial: [phone]   For scheduling needs, including cancellations and rescheduling, please call [place], [phone].       1st Floor: - Lobby - Registration  - Pharmacy  - Lab - Cafe  2nd Floor: - PV Lab - Diagnostic Testing (echo, CT, nuclear med)  3rd Floor: - Vacant  4th Floor: - TCTS (cardiothoracic surgery) - AFib Clinic - Structural Heart Clinic - Vascular Surgery  - Vascular Ultrasound  5th Floor: - HeartCare Cardiology (general and EP) - Clinical Pharmacy for coumadin, hypertension, lipid, weight-loss medications, and med management appointments    Valet parking services will be available as well.

## 2023-07-13 DIAGNOSIS — E039 Hypothyroidism, unspecified: Secondary | ICD-10-CM | POA: Diagnosis not present

## 2023-07-13 DIAGNOSIS — E669 Obesity, unspecified: Secondary | ICD-10-CM | POA: Diagnosis not present

## 2023-07-13 DIAGNOSIS — Z6841 Body Mass Index (BMI) 40.0 and over, adult: Secondary | ICD-10-CM | POA: Diagnosis not present

## 2023-07-19 ENCOUNTER — Telehealth (HOSPITAL_COMMUNITY): Payer: Self-pay | Admitting: *Deleted

## 2023-07-19 DIAGNOSIS — Z6839 Body mass index (BMI) 39.0-39.9, adult: Secondary | ICD-10-CM | POA: Diagnosis not present

## 2023-07-19 DIAGNOSIS — E785 Hyperlipidemia, unspecified: Secondary | ICD-10-CM | POA: Diagnosis not present

## 2023-07-19 DIAGNOSIS — E669 Obesity, unspecified: Secondary | ICD-10-CM | POA: Diagnosis not present

## 2023-07-19 NOTE — Telephone Encounter (Signed)
 Reaching out to patient to offer assistance regarding upcoming cardiac imaging study; pt verbalizes understanding of appt date/time, parking situation and where to check in, pre-test NPO status and medications ordered, and verified current allergies; name and call back number provided for further questions should they arise  Larey Brick RN Navigator Cardiac Imaging [facility] [phone] office [phone] cell  Patient to take 50mg  metoprolol tartrate the night before her CT scan and 100mg  metoprolol tartrate two hours prior to her cardiac CT scan. She is aware to arrive at 3 PM.

## 2023-07-21 ENCOUNTER — Ambulatory Visit (HOSPITAL_COMMUNITY)
Admission: RE | Admit: 2023-07-21 | Discharge: 2023-07-21 | Disposition: A | Discharge status: Home / Self Care | Payer: Federal, State, Local not specified - PPO | Source: Ambulatory Visit | Attending: Cardiovascular Disease | Admitting: Cardiovascular Disease

## 2023-07-21 DIAGNOSIS — F411 Generalized anxiety disorder: Secondary | ICD-10-CM | POA: Diagnosis not present

## 2023-07-21 DIAGNOSIS — R002 Palpitations: Secondary | ICD-10-CM | POA: Insufficient documentation

## 2023-07-21 DIAGNOSIS — R072 Precordial pain: Secondary | ICD-10-CM | POA: Insufficient documentation

## 2023-07-21 DIAGNOSIS — R079 Chest pain, unspecified: Secondary | ICD-10-CM | POA: Insufficient documentation

## 2023-07-21 MED ORDER — METOPROLOL TARTRATE 5 MG/5ML IV SOLN: 5.0000 mg | Freq: Once | INTRAVENOUS | Status: DC

## 2023-07-21 MED ORDER — METOPROLOL TARTRATE 5 MG/5ML IV SOLN
5.0000 mg | Freq: Once | INTRAVENOUS | Status: AC
  Administered 2023-07-21: 5 mg via INTRAVENOUS

## 2023-07-21 MED ORDER — METOPROLOL TARTRATE 5 MG/5ML IV SOLN
INTRAVENOUS | Status: AC
  Filled 2023-07-21: qty 5

## 2023-07-21 MED ORDER — NITROGLYCERIN 0.4 MG SL SUBL
SUBLINGUAL_TABLET | SUBLINGUAL | Status: AC
Start: 2023-07-21 — End: ?
  Filled 2023-07-21: qty 2

## 2023-07-21 MED ORDER — NITROGLYCERIN 0.4 MG SL SUBL
0.8000 mg | SUBLINGUAL_TABLET | Freq: Once | SUBLINGUAL | Status: AC
  Administered 2023-07-21: 0.8 mg via SUBLINGUAL

## 2023-07-21 MED ORDER — DILTIAZEM HCL 25 MG/5ML IV SOLN
INTRAVENOUS | Status: AC
Start: 2023-07-21 — End: ?
  Filled 2023-07-21: qty 5

## 2023-07-21 MED ORDER — IOHEXOL 350 MG/ML SOLN
95.0000 mL | Freq: Once | INTRAVENOUS | Status: AC | PRN
  Administered 2023-07-21: 95 mL via INTRAVENOUS

## 2023-07-27 DIAGNOSIS — E669 Obesity, unspecified: Secondary | ICD-10-CM | POA: Diagnosis not present

## 2023-07-27 DIAGNOSIS — Z6839 Body mass index (BMI) 39.0-39.9, adult: Secondary | ICD-10-CM | POA: Diagnosis not present

## 2023-07-27 DIAGNOSIS — G479 Sleep disorder, unspecified: Secondary | ICD-10-CM | POA: Diagnosis not present

## 2023-08-03 DIAGNOSIS — R790 Abnormal level of blood mineral: Secondary | ICD-10-CM | POA: Diagnosis not present

## 2023-08-03 DIAGNOSIS — Z6838 Body mass index (BMI) 38.0-38.9, adult: Secondary | ICD-10-CM | POA: Diagnosis not present

## 2023-08-03 DIAGNOSIS — E669 Obesity, unspecified: Secondary | ICD-10-CM | POA: Diagnosis not present

## 2023-08-10 DIAGNOSIS — E785 Hyperlipidemia, unspecified: Secondary | ICD-10-CM | POA: Diagnosis not present

## 2023-08-10 DIAGNOSIS — E669 Obesity, unspecified: Secondary | ICD-10-CM | POA: Diagnosis not present

## 2023-08-10 DIAGNOSIS — Z6841 Body Mass Index (BMI) 40.0 and over, adult: Secondary | ICD-10-CM | POA: Diagnosis not present

## 2023-08-16 DIAGNOSIS — E039 Hypothyroidism, unspecified: Secondary | ICD-10-CM | POA: Diagnosis not present

## 2023-08-16 DIAGNOSIS — E559 Vitamin D deficiency, unspecified: Secondary | ICD-10-CM | POA: Diagnosis not present

## 2023-08-16 DIAGNOSIS — Z6839 Body mass index (BMI) 39.0-39.9, adult: Secondary | ICD-10-CM | POA: Diagnosis not present

## 2023-08-16 DIAGNOSIS — E669 Obesity, unspecified: Secondary | ICD-10-CM | POA: Diagnosis not present

## 2023-09-21 DIAGNOSIS — E669 Obesity, unspecified: Secondary | ICD-10-CM | POA: Diagnosis not present

## 2023-09-21 DIAGNOSIS — M2550 Pain in unspecified joint: Secondary | ICD-10-CM | POA: Diagnosis not present

## 2023-09-21 DIAGNOSIS — Z6841 Body Mass Index (BMI) 40.0 and over, adult: Secondary | ICD-10-CM | POA: Diagnosis not present

## 2023-09-21 DIAGNOSIS — R7989 Other specified abnormal findings of blood chemistry: Secondary | ICD-10-CM | POA: Diagnosis not present

## 2023-10-28 DIAGNOSIS — F419 Anxiety disorder, unspecified: Secondary | ICD-10-CM | POA: Diagnosis not present

## 2023-10-28 DIAGNOSIS — E559 Vitamin D deficiency, unspecified: Secondary | ICD-10-CM | POA: Diagnosis not present

## 2023-10-28 DIAGNOSIS — E039 Hypothyroidism, unspecified: Secondary | ICD-10-CM | POA: Diagnosis not present

## 2023-10-28 DIAGNOSIS — R600 Localized edema: Secondary | ICD-10-CM | POA: Diagnosis not present

## 2024-01-10 DIAGNOSIS — R41840 Attention and concentration deficit: Secondary | ICD-10-CM | POA: Diagnosis not present

## 2024-01-23 DIAGNOSIS — R41840 Attention and concentration deficit: Secondary | ICD-10-CM | POA: Diagnosis not present

## 2024-01-23 DIAGNOSIS — F419 Anxiety disorder, unspecified: Secondary | ICD-10-CM | POA: Diagnosis not present

## 2024-02-08 DIAGNOSIS — D225 Melanocytic nevi of trunk: Secondary | ICD-10-CM | POA: Diagnosis not present

## 2024-02-08 DIAGNOSIS — Z86018 Personal history of other benign neoplasm: Secondary | ICD-10-CM | POA: Diagnosis not present

## 2024-02-08 DIAGNOSIS — Z23 Encounter for immunization: Secondary | ICD-10-CM | POA: Diagnosis not present

## 2024-02-08 DIAGNOSIS — Z85820 Personal history of malignant melanoma of skin: Secondary | ICD-10-CM | POA: Diagnosis not present

## 2024-02-08 DIAGNOSIS — L814 Other melanin hyperpigmentation: Secondary | ICD-10-CM | POA: Diagnosis not present

## 2024-03-12 DIAGNOSIS — H6980 Other specified disorders of Eustachian tube, unspecified ear: Secondary | ICD-10-CM | POA: Diagnosis not present

## 2024-03-12 DIAGNOSIS — H9319 Tinnitus, unspecified ear: Secondary | ICD-10-CM | POA: Diagnosis not present

## 2024-05-07 DIAGNOSIS — J111 Influenza due to unidentified influenza virus with other respiratory manifestations: Secondary | ICD-10-CM | POA: Diagnosis not present

## 2024-05-16 ENCOUNTER — Encounter: Payer: Self-pay | Admitting: Obstetrics and Gynecology

## 2024-05-18 ENCOUNTER — Other Ambulatory Visit: Payer: Self-pay | Admitting: Obstetrics and Gynecology

## 2024-05-18 DIAGNOSIS — Z9189 Other specified personal risk factors, not elsewhere classified: Secondary | ICD-10-CM

## 2024-11-13 ENCOUNTER — Other Ambulatory Visit
